# Patient Record
Sex: Male | Born: 1970 | Hispanic: No | Marital: Married | Smoking: Never smoker
Health system: Southern US, Community
[De-identification: ages and names within clinical notes are randomized; demographics above are authoritative.]

## PROBLEM LIST (undated history)

## (undated) DIAGNOSIS — M1990 Unspecified osteoarthritis, unspecified site: Secondary | ICD-10-CM

## (undated) DIAGNOSIS — D689 Coagulation defect, unspecified: Secondary | ICD-10-CM

## (undated) DIAGNOSIS — G709 Myoneural disorder, unspecified: Secondary | ICD-10-CM

## (undated) DIAGNOSIS — F419 Anxiety disorder, unspecified: Secondary | ICD-10-CM

## (undated) DIAGNOSIS — R000 Tachycardia, unspecified: Secondary | ICD-10-CM

## (undated) HISTORY — DX: Unspecified osteoarthritis, unspecified site: M19.90

## (undated) HISTORY — DX: Coagulation defect, unspecified: D68.9

## (undated) HISTORY — PX: HIP ARTHROSCOPY W/ LABRAL DEBRIDEMENT: SHX1749

## (undated) HISTORY — DX: Tachycardia, unspecified: R00.0

## (undated) HISTORY — DX: Anxiety disorder, unspecified: F41.9

## (undated) HISTORY — DX: Myoneural disorder, unspecified: G70.9

---

## 2016-07-03 ENCOUNTER — Encounter (INDEPENDENT_AMBULATORY_CARE_PROVIDER_SITE_OTHER): Payer: Self-pay | Admitting: Orthopaedic Surgery

## 2016-07-03 ENCOUNTER — Ambulatory Visit (INDEPENDENT_AMBULATORY_CARE_PROVIDER_SITE_OTHER): Payer: 59

## 2016-07-03 ENCOUNTER — Ambulatory Visit (INDEPENDENT_AMBULATORY_CARE_PROVIDER_SITE_OTHER): Payer: 59 | Admitting: Orthopaedic Surgery

## 2016-07-03 DIAGNOSIS — M25551 Pain in right hip: Secondary | ICD-10-CM

## 2016-07-03 DIAGNOSIS — M1611 Unilateral primary osteoarthritis, right hip: Secondary | ICD-10-CM

## 2016-07-03 NOTE — Progress Notes (Deleted)
   Office Visit Note   Patient: Ricky Andersen           Date of Birth: 01-01-1971           MRN: [id number] Visit Date: 07/03/2016              Requested by: No referring provider defined for this encounter. PCP: No PCP Per Patient   Assessment & Plan: Visit Diagnoses:  1. Primary osteoarthritis of right hip   2. Pain of right hip joint     Plan: ***  Follow-Up Instructions: Return if symptoms worsen or fail to improve.   Orders:  Orders Placed This Encounter  Procedures  . XR HIP UNILAT W OR W/O PELVIS 2-3 VIEWS RIGHT   No orders of the defined types were placed in this encounter.     Procedures: No procedures performed   Clinical Data: No additional findings.   Subjective: Chief Complaint  Patient presents with  . Right Hip - Pain    PAIN FOR 2 YEARS.    HPI  Review of Systems   Objective: Vital Signs: There were no vitals taken for this visit.  Physical Exam  Ortho Exam  Specialty Comments:  No specialty comments available.  Imaging: No results found.   PMFS History: There are no active problems to display for this patient.  No past medical history on file.  No family history on file.  No past surgical history on file. Social History   Occupational History  . Not on file.   Social History Main Topics  . Smoking status: Not on file  . Smokeless tobacco: Not on file  . Alcohol use Not on file  . Drug use: Unknown  . Sexual activity: Not on file

## 2016-07-03 NOTE — Progress Notes (Signed)
Office Visit Note   Patient: Ricky Andersen           Date of Birth: 1970/10/16           MRN: [id number] Visit Date: 07/03/2016              Requested by: No referring provider defined for this encounter. PCP: No PCP Per Patient   Assessment & Plan: Visit Diagnoses:  1. Primary osteoarthritis of right hip   2. Pain of right hip joint     Plan:  - Total face to face encounter time was greater than 45 minutes and over half of this time was spent in counseling and/or coordination of care. - had extensive discussion about his options; discussed conventional THA vs hip resurfacing and the r/b/a - I will refer him to the orthopedist in [facility] that is doing hip resurfacing and go from there - brochure on hip replacement was given today  Follow-Up Instructions: Return if symptoms worsen or fail to improve.   Orders:  Orders Placed This Encounter  Procedures  . XR HIP UNILAT W OR W/O PELVIS 2-3 VIEWS RIGHT   No orders of the defined types were placed in this encounter.     Procedures: No procedures performed   Clinical Data: No additional findings.   Subjective: Chief Complaint  Patient presents with  . Right Hip - Pain    PAIN FOR 2 YEARS.    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  Does have limp.  Severe decline in quality of life.  Pain is in the groin.  Has had right hip scope with labral debridement with 3 month relief but is now back to the same.  Denies knee pain, back pain, cannot take nsaids.  Has not had steroid injections.      Review of Systems  Constitutional: Negative.   Respiratory: Negative.   Cardiovascular: Negative.   Gastrointestinal: Negative.   All other systems reviewed and are negative.    Objective: Vital Signs: There were no vitals taken for this visit.  Physical Exam  Constitutional: He is oriented to person, place, and time. He appears well-developed and well-nourished.  HENT:  Head:  Normocephalic.  Eyes: Pupils are equal, round, and reactive to light.  Neck: Normal range of motion.  Cardiovascular: Intact distal pulses.   Pulmonary/Chest: Effort normal.  Abdominal: Soft.  Neurological: He is alert and oriented to person, place, and time.  Skin: Skin is warm. Capillary refill takes less than 2 seconds.  Psychiatric: He has a normal mood and affect. His behavior is normal. Judgment and thought content normal.  Nursing note and vitals reviewed.   Right Hip Exam   Tenderness  The patient is experiencing no tenderness.     Muscle Strength  The patient has normal right hip strength.  Comments:  + stinchfield, pain with IR - FABER, radic, sciatica      Specialty Comments:  No specialty comments available.  Imaging: Xr Hip Unilat W Or W/o Pelvis 2-3 Views Right  Result Date: 07/03/2016 Advanced right hip DJD    PMFS History: There are no active problems to display for this patient.  Past Medical History:  Diagnosis Date  . Arthritis     Family History  Problem Relation Age of Onset  . Anesthesia problems Neg Hx   . Broken bones Neg Hx   . Cancer Neg Hx   . Clotting disorder Neg Hx   .  Collagen disease Neg Hx   . Diabetes Neg Hx   . Dislocations Neg Hx   . Osteoporosis Neg Hx   . Rheumatologic disease Neg Hx   . Scoliosis Neg Hx   . Severe sprains Neg Hx     History reviewed. No pertinent surgical history. Social History   Occupational History  . Not on file.   Social History Main Topics  . Smoking status: Never Smoker  . Smokeless tobacco: Never Used  . Alcohol use Not on file  . Drug use: No  . Sexual activity: Yes

## 2016-07-08 NOTE — Addendum Note (Signed)
Addended by: Azucena Cecil on: 07/08/2016 08:37 AM   Modules accepted: Miquel Dunn

## 2016-08-02 ENCOUNTER — Telehealth (INDEPENDENT_AMBULATORY_CARE_PROVIDER_SITE_OTHER): Payer: Self-pay | Admitting: *Deleted

## 2016-08-02 NOTE — Telephone Encounter (Signed)
Pt. Called requesting copy of high resolution X-Ray from 07/03/2016.CB: [phone]

## 2016-08-02 NOTE — Telephone Encounter (Signed)
Left message on machine for patient that his CD is ready for pick up.

## 2016-08-02 NOTE — Telephone Encounter (Signed)
Can you get this for him please? Cd Copy of Xray?

## 2016-08-20 ENCOUNTER — Other Ambulatory Visit (INDEPENDENT_AMBULATORY_CARE_PROVIDER_SITE_OTHER): Payer: Self-pay | Admitting: Orthopaedic Surgery

## 2016-08-20 MED ORDER — CYCLOBENZAPRINE HCL 5 MG PO TABS: 5.0000 mg | ORAL_TABLET | Freq: Three times a day (TID) | ORAL | 2 refills | Status: DC | PRN

## 2016-08-20 MED ORDER — METHYLPREDNISOLONE 4 MG PO TBPK: ORAL_TABLET | ORAL | 0 refills | Status: DC

## 2016-08-28 ENCOUNTER — Telehealth (INDEPENDENT_AMBULATORY_CARE_PROVIDER_SITE_OTHER): Payer: Self-pay | Admitting: Orthopaedic Surgery

## 2016-08-28 DIAGNOSIS — M1611 Unilateral primary osteoarthritis, right hip: Secondary | ICD-10-CM

## 2016-08-28 NOTE — Telephone Encounter (Signed)
Patient is going back home for surgery R hip, so that he can recover with family there.  But since he does not have a GP he is in need of EKG. Are you able to help and order that?  Please call if needed he can explain more.

## 2016-08-31 NOTE — Telephone Encounter (Signed)
Please advise 

## 2016-08-31 NOTE — Telephone Encounter (Signed)
Sure - that's fine

## 2016-09-01 NOTE — Telephone Encounter (Signed)
Order made

## 2016-09-14 ENCOUNTER — Ambulatory Visit (HOSPITAL_COMMUNITY)
Admission: RE | Admit: 2016-09-14 | Discharge: 2016-09-14 | Disposition: A | Discharge status: Home / Self Care | Payer: 59 | Source: Ambulatory Visit | Attending: Internal Medicine | Admitting: Internal Medicine

## 2016-09-14 DIAGNOSIS — M1611 Unilateral primary osteoarthritis, right hip: Secondary | ICD-10-CM | POA: Insufficient documentation

## 2017-01-03 ENCOUNTER — Other Ambulatory Visit: Payer: Self-pay | Admitting: Internal Medicine

## 2017-01-03 DIAGNOSIS — G43109 Migraine with aura, not intractable, without status migrainosus: Secondary | ICD-10-CM

## 2017-01-03 DIAGNOSIS — H9319 Tinnitus, unspecified ear: Secondary | ICD-10-CM

## 2017-01-03 DIAGNOSIS — H539 Unspecified visual disturbance: Secondary | ICD-10-CM

## 2017-01-03 DIAGNOSIS — L568 Other specified acute skin changes due to ultraviolet radiation: Secondary | ICD-10-CM

## 2017-01-17 ENCOUNTER — Ambulatory Visit
Admission: RE | Admit: 2017-01-17 | Discharge: 2017-01-17 | Disposition: A | Discharge status: Home / Self Care | Payer: 59 | Source: Ambulatory Visit | Attending: Internal Medicine | Admitting: Internal Medicine

## 2017-01-17 DIAGNOSIS — H539 Unspecified visual disturbance: Secondary | ICD-10-CM

## 2017-01-17 DIAGNOSIS — H9319 Tinnitus, unspecified ear: Secondary | ICD-10-CM

## 2017-01-17 DIAGNOSIS — G43109 Migraine with aura, not intractable, without status migrainosus: Secondary | ICD-10-CM

## 2017-01-17 DIAGNOSIS — L568 Other specified acute skin changes due to ultraviolet radiation: Secondary | ICD-10-CM

## 2017-01-17 MED ORDER — GADOBENATE DIMEGLUMINE 529 MG/ML IV SOLN
20.0000 mL | Freq: Once | INTRAVENOUS | Status: AC | PRN
  Administered 2017-01-17: 20 mL via INTRAVENOUS

## 2017-07-24 ENCOUNTER — Ambulatory Visit (INDEPENDENT_AMBULATORY_CARE_PROVIDER_SITE_OTHER): Payer: 59

## 2017-07-24 ENCOUNTER — Ambulatory Visit (INDEPENDENT_AMBULATORY_CARE_PROVIDER_SITE_OTHER): Payer: 59 | Admitting: Orthopaedic Surgery

## 2017-07-24 DIAGNOSIS — G8929 Other chronic pain: Secondary | ICD-10-CM

## 2017-07-24 DIAGNOSIS — M1611 Unilateral primary osteoarthritis, right hip: Secondary | ICD-10-CM

## 2017-07-24 DIAGNOSIS — M25561 Pain in right knee: Secondary | ICD-10-CM

## 2017-07-24 NOTE — Progress Notes (Signed)
Office Visit Note   Patient: Ricky Andersen           Date of Birth: 1970/11/30           MRN: [id number] Visit Date: 07/24/2017              Requested by: No referring provider defined for this encounter. PCP: Patient, No Pcp Per   Assessment & Plan: Visit Diagnoses:  1. Primary osteoarthritis of right hip   2. Chronic pain of right knee     Plan: Impression is stable right total hip replacement and right patellar tendinitis.  Referral to physical therapy for hip flexor weakness and patella tendinitis.  Patellar tendon strap was provided today.  Questions encouraged and answered.  Follow-up as needed.  Follow-Up Instructions: Return if symptoms worsen or fail to improve.   Orders:  Orders Placed This Encounter  Procedures  . XR KNEE 3 VIEW RIGHT  . XR HIP UNILAT W OR W/O PELVIS 2-3 VIEWS RIGHT   No orders of the defined types were placed in this encounter.     Procedures: No procedures performed   Clinical Data: No additional findings.   Subjective: No chief complaint on file.   Patient is a 46 year old gentleman comes in for follow-up of his right hip and knee pain.  He underwent right total hip replacement in [place] earlier this year and he is just having some hip flexion weakness otherwise he is done well.  His right knee hurts directly over the tibial tubercle.  He does have a history of Osgood-Schlatter's disease as a child.  He rides his bicycle as exercise.  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    Review of Systems  Constitutional: Negative.   All other systems reviewed and are negative.    Objective: Vital Signs: There were no vitals taken for this visit.  Physical Exam  Constitutional: He is oriented to person, place, and time. He appears well-developed and well-nourished.  HENT:  Head: Normocephalic and atraumatic.  Eyes: Pupils are equal, round, and reactive to light.  Neck: Neck supple.  Pulmonary/Chest: Effort normal.  Abdominal: Soft.   Musculoskeletal: Normal range of motion.  Neurological: He is alert and oriented to person, place, and time.  Skin: Skin is warm.  Psychiatric: He has a normal mood and affect. His behavior is normal. Judgment and thought content normal.  Nursing note and vitals reviewed.   Ortho Exam Right hip exam is benign.  Fully healed surgical scar.  Right knee exam shows no joint effusion.  He is very tender over the tibial tubercle.  Extensor mechanism is intact. Specialty Comments:  No specialty comments available.  Imaging: Xr Hip Unilat W Or W/o Pelvis 2-3 Views Right  Result Date: 07/24/2017 Stable right total hip replacement in good alignment without any complications  Xr Knee 3 View Right  Result Date: 07/24/2017 No significant degenerative joint disease no acute findings.  Ossification of distal patella tendon.    PMFS History: There are no active problems to display for this patient.  No past medical history on file.  Family History  Problem Relation Age of Onset  . Anesthesia problems Neg Hx   . Broken bones Neg Hx   . Cancer Neg Hx   . Clotting disorder Neg Hx   . Collagen disease Neg Hx   . Diabetes Neg Hx   . Dislocations Neg Hx   . Osteoporosis Neg Hx   . Rheumatologic disease Neg Hx   . Scoliosis  Neg Hx   . Severe sprains Neg Hx     Past Surgical History:  Procedure Laterality Date  . HIP ARTHROSCOPY W/ LABRAL DEBRIDEMENT Right    Social History   Occupational History  . Not on file  Tobacco Use  . Smoking status: Never Smoker  . Smokeless tobacco: Never Used  Substance and Sexual Activity  . Alcohol use: Not on file  . Drug use: No  . Sexual activity: Yes

## 2018-10-29 ENCOUNTER — Encounter (INDEPENDENT_AMBULATORY_CARE_PROVIDER_SITE_OTHER): Payer: Self-pay | Admitting: Orthopaedic Surgery

## 2018-10-29 ENCOUNTER — Ambulatory Visit (INDEPENDENT_AMBULATORY_CARE_PROVIDER_SITE_OTHER): Payer: BLUE CROSS/BLUE SHIELD

## 2018-10-29 ENCOUNTER — Ambulatory Visit (INDEPENDENT_AMBULATORY_CARE_PROVIDER_SITE_OTHER): Payer: BLUE CROSS/BLUE SHIELD | Admitting: Orthopaedic Surgery

## 2018-10-29 DIAGNOSIS — M25551 Pain in right hip: Secondary | ICD-10-CM

## 2018-10-29 MED ORDER — PREDNISONE 10 MG (21) PO TBPK: ORAL_TABLET | ORAL | 0 refills | Status: DC

## 2018-10-29 MED ORDER — CYCLOBENZAPRINE HCL 5 MG PO TABS: 5.0000 mg | ORAL_TABLET | Freq: Three times a day (TID) | ORAL | 3 refills | Status: DC | PRN

## 2018-10-29 NOTE — Progress Notes (Signed)
Office Visit Note   Patient: Ricky Andersen           Date of Birth: 12-05-1970           MRN: [id number] Visit Date: 10/29/2018              Requested by: No referring provider defined for this encounter. PCP: Patient, No Pcp Per   Assessment & Plan: Visit Diagnoses:  1. Pain in right hip     Plan: Impression is right hip iliopsoas tendinitis.  Recommend relative rest, outpatient physical therapy, prednisone taper and Flexeril.  Patient has taken NSAIDs without significant relief.  We could consider cortisone injection if he does not see significant improvement.  Components appear to be well-positioned and without complication. Total face to face encounter time was greater than 25 minutes and over half of this time was spent in counseling and/or coordination of care.  Follow-Up Instructions: Return if symptoms worsen or fail to improve.   Orders:  Orders Placed This Encounter  Procedures  . XR HIP UNILAT W OR W/O PELVIS 2-3 VIEWS RIGHT   Meds ordered this encounter  Medications  . predniSONE (STERAPRED UNI-PAK 21 TAB) 10 MG (21) TBPK tablet    Sig: Take as directed    Dispense:  21 tablet    Refill:  0  . cyclobenzaprine (FLEXERIL) 5 MG tablet    Sig: Take 1-2 tablets (5-10 mg total) by mouth 3 (three) times daily as needed for muscle spasms.    Dispense:  30 tablet    Refill:  3      Procedures: No procedures performed   Clinical Data: No additional findings.   Subjective: Chief Complaint  Patient presents with  . Right Hip - Pain    Imir is a 48 year old gentleman who comes in with couple weeks of increasing right hip pain that is worse with sitting.  The pain is localized to the groin and hip flexor region.  He denies any constant constitutional symptoms he had a recent fever but quickly resolved and was determined to be iron related to the hip.  He had a CRP level drawn recently and it was normal.   Review of Systems  Constitutional: Negative.   All  other systems reviewed and are negative.    Objective: Vital Signs: There were no vitals taken for this visit.  Physical Exam Vitals signs and nursing note reviewed.  Constitutional:      Appearance: He is well-developed.  HENT:     Head: Normocephalic and atraumatic.  Eyes:     Pupils: Pupils are equal, round, and reactive to light.  Neck:     Musculoskeletal: Neck supple.  Pulmonary:     Effort: Pulmonary effort is normal.  Abdominal:     Palpations: Abdomen is soft.  Musculoskeletal: Normal range of motion.  Skin:    General: Skin is warm.  Neurological:     Mental Status: He is alert and oriented to person, place, and time.  Psychiatric:        Behavior: Behavior normal.        Thought Content: Thought content normal.        Judgment: Judgment normal.     Ortho Exam Right hip exam shows a fully healed surgical scar.  There is no evidence of infection.  He has reproducible pain in the groin with a resisted straight leg raise.  Otherwise any exam is unremarkable. Specialty Comments:  No specialty comments available.  Imaging: Xr Hip  Unilat W Or W/o Pelvis 2-3 Views Right  Result Date: 10/29/2018 Stable right total hip replacement with good bony ingrowth.  Components are well-positioned.    PMFS History: There are no active problems to display for this patient.  History reviewed. No pertinent past medical history.  Family History  Problem Relation Age of Onset  . Anesthesia problems Neg Hx   . Broken bones Neg Hx   . Cancer Neg Hx   . Clotting disorder Neg Hx   . Collagen disease Neg Hx   . Diabetes Neg Hx   . Dislocations Neg Hx   . Osteoporosis Neg Hx   . Rheumatologic disease Neg Hx   . Scoliosis Neg Hx   . Severe sprains Neg Hx     Past Surgical History:  Procedure Laterality Date  . HIP ARTHROSCOPY W/ LABRAL DEBRIDEMENT Right    Social History   Occupational History  . Not on file  Tobacco Use  . Smoking status: Never Smoker  . Smokeless  tobacco: Never Used  Substance and Sexual Activity  . Alcohol use: Not on file  . Drug use: No  . Sexual activity: Yes

## 2018-11-05 ENCOUNTER — Encounter (INDEPENDENT_AMBULATORY_CARE_PROVIDER_SITE_OTHER): Payer: Self-pay | Admitting: Orthopaedic Surgery

## 2018-11-16 DIAGNOSIS — S76011D Strain of muscle, fascia and tendon of right hip, subsequent encounter: Secondary | ICD-10-CM | POA: Diagnosis not present

## 2018-11-16 DIAGNOSIS — M7631 Iliotibial band syndrome, right leg: Secondary | ICD-10-CM | POA: Diagnosis not present

## 2018-11-16 DIAGNOSIS — M545 Low back pain: Secondary | ICD-10-CM | POA: Diagnosis not present

## 2018-12-27 DIAGNOSIS — M7631 Iliotibial band syndrome, right leg: Secondary | ICD-10-CM | POA: Diagnosis not present

## 2018-12-27 DIAGNOSIS — M545 Low back pain: Secondary | ICD-10-CM | POA: Diagnosis not present

## 2018-12-27 DIAGNOSIS — S76011D Strain of muscle, fascia and tendon of right hip, subsequent encounter: Secondary | ICD-10-CM | POA: Diagnosis not present

## 2019-01-22 DIAGNOSIS — R42 Dizziness and giddiness: Secondary | ICD-10-CM | POA: Diagnosis not present

## 2019-01-22 DIAGNOSIS — R002 Palpitations: Secondary | ICD-10-CM | POA: Diagnosis not present

## 2019-01-22 DIAGNOSIS — F419 Anxiety disorder, unspecified: Secondary | ICD-10-CM | POA: Diagnosis not present

## 2019-01-22 DIAGNOSIS — R030 Elevated blood-pressure reading, without diagnosis of hypertension: Secondary | ICD-10-CM | POA: Diagnosis not present

## 2019-01-23 DIAGNOSIS — R209 Unspecified disturbances of skin sensation: Secondary | ICD-10-CM | POA: Diagnosis not present

## 2019-01-23 DIAGNOSIS — R42 Dizziness and giddiness: Secondary | ICD-10-CM | POA: Diagnosis not present

## 2019-01-23 DIAGNOSIS — R002 Palpitations: Secondary | ICD-10-CM | POA: Diagnosis not present

## 2019-01-24 ENCOUNTER — Telehealth: Payer: Self-pay | Admitting: *Deleted

## 2019-01-24 NOTE — Telephone Encounter (Signed)
REFERRAL SENT TO SCHEDULING AND NOTES ON FILE FROM KATIE HYATT,NP [phone]

## 2019-01-28 ENCOUNTER — Telehealth: Payer: Self-pay | Admitting: *Deleted

## 2019-01-28 ENCOUNTER — Other Ambulatory Visit: Payer: Self-pay | Admitting: *Deleted

## 2019-01-28 DIAGNOSIS — R002 Palpitations: Secondary | ICD-10-CM

## 2019-01-28 DIAGNOSIS — R42 Dizziness and giddiness: Secondary | ICD-10-CM

## 2019-01-28 NOTE — Telephone Encounter (Signed)
[employer] to ship 14 day cardiac event monitor to patient.  Instructions reviewed briefly as they are included in the monitor kit.

## 2019-01-31 ENCOUNTER — Encounter (INDEPENDENT_AMBULATORY_CARE_PROVIDER_SITE_OTHER): Payer: Self-pay

## 2019-01-31 DIAGNOSIS — R002 Palpitations: Secondary | ICD-10-CM | POA: Diagnosis not present

## 2019-01-31 DIAGNOSIS — R42 Dizziness and giddiness: Secondary | ICD-10-CM | POA: Diagnosis not present

## 2019-02-13 ENCOUNTER — Other Ambulatory Visit: Payer: Self-pay

## 2019-02-25 ENCOUNTER — Telehealth: Payer: Self-pay

## 2019-02-25 NOTE — Telephone Encounter (Signed)
I called pt. Pt's meds, allergies, and PMH were updated.  Pt believes that he may have had a TIA or panic attack on 01/17/19. He has had residual symptoms. He has had cardiac monitor which did not show anything concerning, per pt. Pt does report insomnia issues.  Pt has had a sleep study 9-10 years ago, was negative for osa, per pt.  Pt does endorse snoring.  Epworth Sleepiness Scale 0= would never doze 1= slight chance of dozing 2= moderate chance of dozing 3= high chance of dozing  Sitting and reading: 0 Watching TV: 2 Sitting inactive in a public place (ex. Theater or meeting): 0 As a passenger in a car for an hour without a break: 1 Lying down to rest in the afternoon: 1 Sitting and talking to someone: 0 Sitting quietly after lunch (no alcohol): 0 In a car, while stopped in traffic: 0 Total: 4  FSS: 37  Pt verbalized understanding of the doxy.me process.

## 2019-02-26 ENCOUNTER — Other Ambulatory Visit: Payer: Self-pay

## 2019-02-26 ENCOUNTER — Encounter: Payer: Self-pay | Admitting: Neurology

## 2019-02-26 ENCOUNTER — Ambulatory Visit (INDEPENDENT_AMBULATORY_CARE_PROVIDER_SITE_OTHER): Payer: BC Managed Care – PPO | Admitting: Neurology

## 2019-02-26 DIAGNOSIS — R419 Unspecified symptoms and signs involving cognitive functions and awareness: Secondary | ICD-10-CM | POA: Diagnosis not present

## 2019-02-26 DIAGNOSIS — R42 Dizziness and giddiness: Secondary | ICD-10-CM | POA: Diagnosis not present

## 2019-02-26 DIAGNOSIS — H81399 Other peripheral vertigo, unspecified ear: Secondary | ICD-10-CM

## 2019-02-26 DIAGNOSIS — G479 Sleep disorder, unspecified: Secondary | ICD-10-CM

## 2019-02-26 DIAGNOSIS — R202 Paresthesia of skin: Secondary | ICD-10-CM | POA: Diagnosis not present

## 2019-02-26 NOTE — Progress Notes (Signed)
Star Age, MD, PhD [facility] [address], [address] [place], [place] [address]   Virtual Visit via Video Note on 02/26/2019:  I connected with Ricky Andersen on 02/26/19 at  2:00 PM EDT by a video enabled telemedicine application and verified that I am speaking with the correct person using two identifiers.   I discussed the limitations of evaluation and management by telemedicine and the availability of in person appointments. The patient expressed understanding and agreed to proceed.  History of Present Illness: Ricky Andersen is a 48 year old right-handed gentleman with an underlying medical history of anxiety, and neck pain, who presents for a virtual, video based appointment via doxy.me for evaluation of his intermittent dizziness, foggy headedness, recent spelt of generalized weakness and tingling as well as abnormal head sensation.  Patient is unaccompanied today and joins via laptop.  I am located in my office.  He is referred by his primary care physician Dr. Virgina Jock and I reviewed a telemedicine note from 01/22/2019, at which time he saw a nurse practitioner [facility].  The patient reports an episode of sudden onset of tingling and generalized weakness on 01/17/2019.  He was in the process of doing a wellness walk and approximately half a mile into the walk his symptoms started.  He did not abort the walk and did not seek immediate medical attention, kept walking.  He felt that his heart was racing.  He has since then had intermittent dizziness, no actual room spinning type sensation but sometimes it feels like things are moving.  He has no history of migraines and has had some intermittent abnormal head sensations, no actual pain, some tingling down the left side of his head.  He had no one-sided weakness or numbness or droopy face or slurring of speech at the time of this event.  Some 2 years ago he had a brain MRI in the context of headache.  He had  some blurry vision at the time.  He had a brain MRI with and without contrast on 01/17/2017 and I reviewed the results: IMPRESSION: Normal MRI appearance of the brain.   No explanation for visual abnormality. Left side visible middle ear and facial nerve appear within normal limits.   He reports that he had a panic attack in the past around the time that he had his hip surgery.  His symptoms with the recent event felt different.  He had a recent cardiac event monitor which was unremarkable.  This was from 01/31/2019 through 02/13/2019, results are available in epic.  He had blood work through his primary care recently with unremarkable BMP, CBC, TSH.  He has not been sleeping well.  He has difficulty with sleep initiation and sleep maintenance for years.  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  Belsomra caused daytime grogginess.  He does take Lunesta as needed.  He has a family history of sleep apnea in his father.  He has eliminated caffeine in the past couple of weeks secondary to his insomnia.  His sleep has become worse.  He denies snoring.  He lives with his wife and 2 children.  His 95-year-old daughter sleeps in the bed with him.  They have no pets.He had blood work through his primary care physician's office on 01/23/2019 which I reviewed: BMP was unremarkable,, creatinine 1.0, BUN 21 at the time.  CBC with differential was unremarkable, TSH normal.    His Past Medical History Is Significant For: No past medical  history on file.  His Past Surgical History Is Significant For: Past Surgical History:  Procedure Laterality Date   HIP ARTHROSCOPY W/ LABRAL DEBRIDEMENT Right     His Family History Is Significant For: Family History  Problem Relation Age of Onset   Anesthesia problems Neg Hx    Broken bones Neg Hx    Cancer Neg Hx    Clotting disorder Neg Hx    Collagen disease Neg Hx    Diabetes Neg Hx    Dislocations Neg Hx    Osteoporosis Neg Hx    Rheumatologic disease Neg  Hx    Scoliosis Neg Hx    Severe sprains Neg Hx     His Social History Is Significant For: Social History   Socioeconomic History   Marital status: Married    Spouse name: Not on file   Number of children: Not on file   Years of education: Not on file   Highest education level: Not on file  Occupational History   Not on file  Social Needs   Financial resource strain: Not on file   Food insecurity    Worry: Not on file    Inability: Not on file   Transportation needs    Medical: Not on file    Non-medical: Not on file  Tobacco Use   Smoking status: Never Smoker   Smokeless tobacco: Never Used  Substance and Sexual Activity   Alcohol use: Not on file   Drug use: No   Sexual activity: Yes  Lifestyle   Physical activity    Days per week: Not on file    Minutes per session: Not on file   Stress: Not on file  Relationships   Social connections    Talks on phone: Not on file    Gets together: Not on file    Attends religious service: Not on file    Active member of club or organization: Not on file    Attends meetings of clubs or organizations: Not on file    Relationship status: Not on file  Other Topics Concern   Not on file  Social History Narrative   Not on file    His Allergies Are:  No Known Allergies:   His Current Medications Are:  Outpatient Encounter Medications as of 02/26/2019  Medication Sig   Eszopiclone (LUNESTA PO) Take by mouth.   propranolol (INDERAL) 10 MG tablet Take 10 mg by mouth daily as needed.   No facility-administered encounter medications on file as of 02/26/2019.   :   Review of Systems:  Out of a complete 14 point review of systems, all are reviewed and negative with the exception of these symptoms as listed below:  Observations/Objective: On examination he is pleasant and conversant, in no acute distress, perhaps mildly anxious appearing.  Good comprehension skills, good language skills, he is oriented,  speech is clear without dysarthria or pressure, no hypophonia.  Hearing is grossly intact, face is symmetric with normal facial animation noted, extraocular movements well preserved in all directions without obvious nystagmus noted.  Airway examination reveals a somewhat small airway entry, slightly larger uvula, otherwise normal findings, tongue protrudes centrally in palate elevates symmetrically, shoulder height is equal, shoulder shrug fairly symmetrical perhaps right shoulder slightly higher than left. Motor examination reveals normal-appearing muscle bulk.  No abnormal involuntary movements are noted.  No drift, no postural or action tremor in the upper extremities.  Romberg is negative.  Cerebellar testing shows no dysmetria or intention  tremor, fine motor skills well preserved in the upper extremities with finger taps and hand movements.  He stands without difficulty, walking is normal, posture normal, well preserved arm swing,Tandem walk unremarkable.  Assessment and Plan:  In summary, Ricky Andersen is a very pleasant 48 y.o.-year old male with an underlying medical history of anxiety, and neck pain, who presents for evaluation of his dizziness and recent spell about 5 weeks ago of sudden onset of tingling and global weakness. He has since then had more anxiety and insomnia, intermittent tingling in the head and pressure like sensation, mental fogginess. No Telltale migrainous headaches or vertigo spells.  Today's examination shows overall benign findings, albeit a limited examination was only possible through the video visit. I suggested with proceed with a brain MRI w/wo contrast. His history is not suggestive of a TIA.  Given the intermittent nature of his symptoms we will also proceed with an EEG.  We will call him with his test results to keep them posted.  Given his sleep difficulty, we can certainly consider a sleep study.  He is advised to think about it. dI did not suggest any new medications  today.  We will keep them posted as to his test results by phone call and consider a follow-up appointment after testing if needed. I answered all his questions today and he was in agreement.  Follow Up Instructions:    I discussed the assessment and treatment plan with the patient. The patient was provided an opportunity to ask questions and all were answered. The patient agreed with the plan and demonstrated an understanding of the instructions.   The patient was advised to call back or seek an in-person evaluation if the symptoms worsen or if the condition fails to improve as anticipated.   I provided 40 minutes of non-face-to-face time during this encounter.   Star Age, MD

## 2019-02-26 NOTE — Patient Instructions (Signed)
Given verbally, during today's virtual video-based encounter, with verbal feedback received.   

## 2019-03-03 DIAGNOSIS — F411 Generalized anxiety disorder: Secondary | ICD-10-CM | POA: Diagnosis not present

## 2019-03-04 ENCOUNTER — Telehealth: Payer: Self-pay | Admitting: Neurology

## 2019-03-04 NOTE — Telephone Encounter (Signed)
I spoke to the patient and due to the cost would like to hold off. I did offer the payment plan with him  Dillon Bjork: [phone] (exp. 03/04/19 to 08/30/19)

## 2019-03-06 ENCOUNTER — Encounter: Payer: Self-pay | Admitting: Neurology

## 2019-03-06 NOTE — Addendum Note (Signed)
Addended by: Lester Monroe A on: 03/06/2019 09:13 AM   Modules accepted: Orders

## 2019-03-07 DIAGNOSIS — Z20828 Contact with and (suspected) exposure to other viral communicable diseases: Secondary | ICD-10-CM | POA: Diagnosis not present

## 2019-03-12 DIAGNOSIS — F411 Generalized anxiety disorder: Secondary | ICD-10-CM | POA: Diagnosis not present

## 2019-03-17 DIAGNOSIS — F411 Generalized anxiety disorder: Secondary | ICD-10-CM | POA: Diagnosis not present

## 2019-03-24 DIAGNOSIS — F411 Generalized anxiety disorder: Secondary | ICD-10-CM | POA: Diagnosis not present

## 2019-03-27 DIAGNOSIS — R42 Dizziness and giddiness: Secondary | ICD-10-CM | POA: Diagnosis not present

## 2019-03-27 DIAGNOSIS — R202 Paresthesia of skin: Secondary | ICD-10-CM | POA: Diagnosis not present

## 2019-03-27 DIAGNOSIS — G4700 Insomnia, unspecified: Secondary | ICD-10-CM | POA: Diagnosis not present

## 2019-03-27 DIAGNOSIS — G4452 New daily persistent headache (NDPH): Secondary | ICD-10-CM | POA: Diagnosis not present

## 2019-03-31 DIAGNOSIS — F411 Generalized anxiety disorder: Secondary | ICD-10-CM | POA: Diagnosis not present

## 2019-04-07 DIAGNOSIS — F411 Generalized anxiety disorder: Secondary | ICD-10-CM | POA: Diagnosis not present

## 2019-04-09 ENCOUNTER — Ambulatory Visit: Payer: BC Managed Care – PPO | Admitting: Neurology

## 2019-04-09 DIAGNOSIS — H81399 Other peripheral vertigo, unspecified ear: Secondary | ICD-10-CM | POA: Diagnosis not present

## 2019-04-09 DIAGNOSIS — G479 Sleep disorder, unspecified: Secondary | ICD-10-CM

## 2019-04-09 DIAGNOSIS — R42 Dizziness and giddiness: Secondary | ICD-10-CM

## 2019-04-09 DIAGNOSIS — R202 Paresthesia of skin: Secondary | ICD-10-CM

## 2019-04-09 DIAGNOSIS — R419 Unspecified symptoms and signs involving cognitive functions and awareness: Secondary | ICD-10-CM

## 2019-04-09 DIAGNOSIS — G4733 Obstructive sleep apnea (adult) (pediatric): Secondary | ICD-10-CM | POA: Diagnosis not present

## 2019-04-09 DIAGNOSIS — R0683 Snoring: Secondary | ICD-10-CM

## 2019-04-15 ENCOUNTER — Encounter: Payer: Self-pay | Admitting: Neurology

## 2019-04-17 ENCOUNTER — Telehealth: Payer: Self-pay

## 2019-04-17 NOTE — Procedures (Signed)
  Patient Information     First Name: Ricky Last Name: Andersen ID: [id number]  Birth Date: 09/28/1970 Age: 48 Gender: Male  Referring Provider: Jenny Reichmann Russo,MD        Epworth:  4   Sleep Study Information     Study Date: Apr 09, 2019 S/H/A Version: 001.001.001.001 / 4.1.1528 / 27  History:      48 year old man with a history of anxiety, and neck pain, who reports intermittent dizziness, and foggy headedness.    Summary & Diagnosis:      Primary snoring Recommendations:        This home sleep test does not demonstrate any significant obstructive or central sleep disordered breathing. Some snoring was noted and appeared to be mild and intermittent. Other causes of the patient's symptoms, including circadian rhythm disturbances, an underlying mood disorder, medication effect and/or an underlying medical problem cannot be ruled out based on this test. Clinical correlation is recommended. The patient should be cautioned not to drive, work at heights, or operate dangerous or heavy equipment when tired or sleepy. Review and reiteration of good sleep hygiene measures should be pursued with any patient. The patient can follow up with his referring provider, who will be notified of the test results.   I certify that I have reviewed the raw data recording prior to the issuance of this report in accordance with the standards of the American Academy of Sleep Medicine (AASM).  Star Age, MD, PhD Diplomat, ABPN (Neurology and Sleep)                  Sleep Summary  Oxygen Saturation Statistics   Start Study Time: End Study Time: Total Recording Time:  11:27:00PM 6:33:46 AM 7 hrs, 6 min  Total Sleep Time % REM of Sleep Time:  6 hrs, 18 min 30.1    Mean: 94 Minimum: 90 Maximum: 98  Mean of Desaturations Nadirs (%):   92  Oxygen Desatur. %: 4-9 10-20 >20 Total  Events Number Total  3 100.0  0 0.0  0 0.0  3 100.0  Oxygen Saturation: <90 <=88 <85 <80 <70  Duration (minutes): Sleep %  0.0 0.0 0.0 0.0 0.0 0.0 0.0 0.0 0.0 0.0     Respiratory Indices      Total Events REM NREM All Night  pRDI:  31  pAHI:  21 ODI:  3  pAHIc:  2  % CSR: 0.0 7.5 2.7 0.0 0.6 3.9 3.6 0.7 0.3 5.0 3.4 0.5 0.4       Pulse Rate Statistics during Sleep (BPM)      Mean: 72 Minimum: 54 Maximum: 104    Indices are calculated using technically valid sleep time of  6 hrs, 16 min. Central-Indices are calculated using technically valid sleep time of  5  hrs, 42 min. pRDI/pAHI are calculated using oxi desaturations ? 3% Sit BodyPosition Statistics  Position Supine Prone Right Left Non-Supine  Sleep (min) 117.0 182.5 63.0 16.0 261.5  Sleep % 30.9 48.2 16.6 4.2 69.1  pRDI 3.6 7.7 1.0 0.0 5.6  pAHI 3.1 4.7 1.0 0.0 3.5  ODI 1.0 0.3 0.0 0.0 0.2     Snoring Statistics Snoring Level (dB) >40 >50 >60 >70 >80 >Threshold (45)  Sleep (min) 23.8 2.3 0.5 0.0 0.0 3.5  Sleep % 6.3 0.6 0.1 0.0 0.0 0.9    Mean: 40 dB Sleep Stages Chart   * Reference values are according to AASM guidelines

## 2019-04-17 NOTE — Progress Notes (Signed)
Patient referred by Dr. Virgina Jock, seen by me on 02/26/19 in [facility] for dizziness, HST on 04/09/19.   Please call and notify the patient that the recent home sleep test did not show any significant obstructive sleep apnea. Mild intermittent snoring was noted.  At this juncture, he can FU with PCP. He also has FU pending with Dr. Everette Rank at [facility].  Please remind patient to try to maintain good sleep hygiene, which means: Keep a regular sleep and wake schedule and make enough time for sleep (7 1/2 to 8 1/2 hours for the average adult), try not to exercise or have a meal within 2 hours of your bedtime, try to keep your bedroom conducive for sleep, that is, cool and dark, without light distractors such as an illuminated alarm clock, and refrain from watching TV right before sleep or in the middle of the night and do not keep the TV or radio on during the night. If a nightlight is used, have it away from the visual field. Also, try not to use or play on electronic devices at bedtime, such as your cell phone, tablet PC or laptop. If you like to read at bedtime on an electronic device, try to dim the background light as much as possible. Do not eat in the middle of the night. Keep pets away from the bedroom environment. For stress relief, try meditation, deep breathing exercises (there are many books and CDs available), a white noise machine or fan can help to diffuse other noise distractors, such as traffic noise. Do not drink alcohol before bedtime, as it can disturb sleep and cause middle of the night awakenings. Never mix alcohol and sedating medications! Avoid narcotic pain medication close to bedtime, as opioids/narcotics can suppress breathing drive and breathing effort.   Thanks,  Star Age, MD, PhD [facility])

## 2019-04-17 NOTE — Telephone Encounter (Signed)
I called pt. I advised pt that Dr. Rexene Alberts reviewed pt's sleep study and found that pt did not show any significant osa but mild intermittent snoring was noted. Dr. Rexene Alberts recommends that pt follow up with Dr. Virgina Jock and Dr. Everette Rank. I reviewed sleep hygiene recommendations with the pt, including trying to keep a regular sleep wake schedule, avoiding electronics in the bedroom, keeping the bedroom cool, dark, and quiet, and avoiding eating or exercising within 2 hours of bedtime as well as eating in the middle of the night. I advised pt to keep pets out of the bedroom. I discussed with pt the importance of stress relief and to try meditation, deep breathing exercises, and/or a white noise machine or fan to diffuse other noise distractors. I advised pt to not drink alcohol before bedtime and to never mix alcohol and sedating medications. Pt was advised to avoid narcotic pain medication close to bedtime. I advised pt that a copy of these sleep study results will be sent to Dr. Virgina Jock. Pt verbalized understanding of results. Pt had no questions at this time but was encouraged to call back if questions arise.

## 2019-04-17 NOTE — Telephone Encounter (Signed)
-----   Message from Ricky Age, MD sent at 04/17/2019  8:35 AM EDT ----- Patient referred by Dr. Virgina Jock, seen by me on 02/26/19 in [facility] for dizziness, HST on 04/09/19.   Please call and notify the patient that the recent home sleep test did not show any significant obstructive sleep apnea. Mild intermittent snoring was noted.  At this juncture, he can FU with PCP. He also has FU pending with Dr. Everette Rank at [facility].  Please remind patient to try to maintain good sleep hygiene, which means: Keep a regular sleep and wake schedule and make enough time for sleep (7 1/2 to 8 1/2 hours for the average adult), try not to exercise or have a meal within 2 hours of your bedtime, try to keep your bedroom conducive for sleep, that is, cool and dark, without light distractors such as an illuminated alarm clock, and refrain from watching TV right before sleep or in the middle of the night and do not keep the TV or radio on during the night. If a nightlight is used, have it away from the visual field. Also, try not to use or play on electronic devices at bedtime, such as your cell phone, tablet PC or laptop. If you like to read at bedtime on an electronic device, try to dim the background light as much as possible. Do not eat in the middle of the night. Keep pets away from the bedroom environment. For stress relief, try meditation, deep breathing exercises (there are many books and CDs available), a white noise machine or fan can help to diffuse other noise distractors, such as traffic noise. Do not drink alcohol before bedtime, as it can disturb sleep and cause middle of the night awakenings. Never mix alcohol and sedating medications! Avoid narcotic pain medication close to bedtime, as opioids/narcotics can suppress breathing drive and breathing effort.   Thanks,  Ricky Age, MD, PhD [facility])

## 2019-04-23 DIAGNOSIS — F4322 Adjustment disorder with anxiety: Secondary | ICD-10-CM | POA: Diagnosis not present

## 2019-04-28 DIAGNOSIS — R202 Paresthesia of skin: Secondary | ICD-10-CM | POA: Diagnosis not present

## 2019-04-28 DIAGNOSIS — F411 Generalized anxiety disorder: Secondary | ICD-10-CM | POA: Diagnosis not present

## 2019-05-05 DIAGNOSIS — F411 Generalized anxiety disorder: Secondary | ICD-10-CM | POA: Diagnosis not present

## 2019-05-12 DIAGNOSIS — F411 Generalized anxiety disorder: Secondary | ICD-10-CM | POA: Diagnosis not present

## 2019-05-29 DIAGNOSIS — F4320 Adjustment disorder, unspecified: Secondary | ICD-10-CM | POA: Diagnosis not present

## 2019-06-06 DIAGNOSIS — F422 Mixed obsessional thoughts and acts: Secondary | ICD-10-CM | POA: Diagnosis not present

## 2019-06-06 DIAGNOSIS — F411 Generalized anxiety disorder: Secondary | ICD-10-CM | POA: Diagnosis not present

## 2019-06-07 DIAGNOSIS — F4320 Adjustment disorder, unspecified: Secondary | ICD-10-CM | POA: Diagnosis not present

## 2019-06-12 DIAGNOSIS — R42 Dizziness and giddiness: Secondary | ICD-10-CM | POA: Diagnosis not present

## 2019-06-12 DIAGNOSIS — F422 Mixed obsessional thoughts and acts: Secondary | ICD-10-CM | POA: Diagnosis not present

## 2019-06-12 DIAGNOSIS — R202 Paresthesia of skin: Secondary | ICD-10-CM | POA: Diagnosis not present

## 2019-06-12 DIAGNOSIS — F4320 Adjustment disorder, unspecified: Secondary | ICD-10-CM | POA: Diagnosis not present

## 2019-06-19 DIAGNOSIS — F4320 Adjustment disorder, unspecified: Secondary | ICD-10-CM | POA: Diagnosis not present

## 2019-07-03 DIAGNOSIS — F4320 Adjustment disorder, unspecified: Secondary | ICD-10-CM | POA: Diagnosis not present

## 2019-07-04 DIAGNOSIS — F4322 Adjustment disorder with anxiety: Secondary | ICD-10-CM | POA: Diagnosis not present

## 2019-07-10 DIAGNOSIS — R202 Paresthesia of skin: Secondary | ICD-10-CM | POA: Diagnosis not present

## 2019-07-10 DIAGNOSIS — H538 Other visual disturbances: Secondary | ICD-10-CM | POA: Diagnosis not present

## 2019-07-10 DIAGNOSIS — F4320 Adjustment disorder, unspecified: Secondary | ICD-10-CM | POA: Diagnosis not present

## 2019-07-10 DIAGNOSIS — R413 Other amnesia: Secondary | ICD-10-CM | POA: Diagnosis not present

## 2019-07-10 DIAGNOSIS — G4700 Insomnia, unspecified: Secondary | ICD-10-CM | POA: Diagnosis not present

## 2019-08-01 DIAGNOSIS — G4709 Other insomnia: Secondary | ICD-10-CM | POA: Diagnosis not present

## 2019-08-01 DIAGNOSIS — F4322 Adjustment disorder with anxiety: Secondary | ICD-10-CM | POA: Diagnosis not present

## 2019-08-14 DIAGNOSIS — F4323 Adjustment disorder with mixed anxiety and depressed mood: Secondary | ICD-10-CM | POA: Diagnosis not present

## 2019-08-14 DIAGNOSIS — F5101 Primary insomnia: Secondary | ICD-10-CM | POA: Diagnosis not present

## 2019-08-19 DIAGNOSIS — F4323 Adjustment disorder with mixed anxiety and depressed mood: Secondary | ICD-10-CM | POA: Diagnosis not present

## 2019-08-19 DIAGNOSIS — F5101 Primary insomnia: Secondary | ICD-10-CM | POA: Diagnosis not present

## 2019-08-21 DIAGNOSIS — R002 Palpitations: Secondary | ICD-10-CM | POA: Diagnosis not present

## 2019-08-21 DIAGNOSIS — R030 Elevated blood-pressure reading, without diagnosis of hypertension: Secondary | ICD-10-CM | POA: Diagnosis not present

## 2019-08-21 DIAGNOSIS — Z0000 Encounter for general adult medical examination without abnormal findings: Secondary | ICD-10-CM | POA: Diagnosis not present

## 2019-08-21 DIAGNOSIS — Z1331 Encounter for screening for depression: Secondary | ICD-10-CM | POA: Diagnosis not present

## 2019-08-21 DIAGNOSIS — R7301 Impaired fasting glucose: Secondary | ICD-10-CM | POA: Diagnosis not present

## 2019-08-21 DIAGNOSIS — F439 Reaction to severe stress, unspecified: Secondary | ICD-10-CM | POA: Diagnosis not present

## 2019-08-28 DIAGNOSIS — F4323 Adjustment disorder with mixed anxiety and depressed mood: Secondary | ICD-10-CM | POA: Diagnosis not present

## 2019-08-28 DIAGNOSIS — F5101 Primary insomnia: Secondary | ICD-10-CM | POA: Diagnosis not present

## 2019-09-02 DIAGNOSIS — F5101 Primary insomnia: Secondary | ICD-10-CM | POA: Diagnosis not present

## 2019-09-02 DIAGNOSIS — F4323 Adjustment disorder with mixed anxiety and depressed mood: Secondary | ICD-10-CM | POA: Diagnosis not present

## 2019-09-13 DIAGNOSIS — Z20828 Contact with and (suspected) exposure to other viral communicable diseases: Secondary | ICD-10-CM | POA: Diagnosis not present

## 2019-09-16 DIAGNOSIS — F4323 Adjustment disorder with mixed anxiety and depressed mood: Secondary | ICD-10-CM | POA: Diagnosis not present

## 2019-09-16 DIAGNOSIS — F5101 Primary insomnia: Secondary | ICD-10-CM | POA: Diagnosis not present

## 2019-09-23 DIAGNOSIS — F5101 Primary insomnia: Secondary | ICD-10-CM | POA: Diagnosis not present

## 2019-09-23 DIAGNOSIS — F4323 Adjustment disorder with mixed anxiety and depressed mood: Secondary | ICD-10-CM | POA: Diagnosis not present

## 2019-09-30 DIAGNOSIS — F5101 Primary insomnia: Secondary | ICD-10-CM | POA: Diagnosis not present

## 2019-09-30 DIAGNOSIS — F4323 Adjustment disorder with mixed anxiety and depressed mood: Secondary | ICD-10-CM | POA: Diagnosis not present

## 2019-10-02 DIAGNOSIS — R413 Other amnesia: Secondary | ICD-10-CM | POA: Diagnosis not present

## 2019-10-02 DIAGNOSIS — R4189 Other symptoms and signs involving cognitive functions and awareness: Secondary | ICD-10-CM | POA: Diagnosis not present

## 2019-10-02 DIAGNOSIS — R202 Paresthesia of skin: Secondary | ICD-10-CM | POA: Diagnosis not present

## 2019-10-07 DIAGNOSIS — F4323 Adjustment disorder with mixed anxiety and depressed mood: Secondary | ICD-10-CM | POA: Diagnosis not present

## 2019-10-07 DIAGNOSIS — F5101 Primary insomnia: Secondary | ICD-10-CM | POA: Diagnosis not present

## 2019-10-14 DIAGNOSIS — F5101 Primary insomnia: Secondary | ICD-10-CM | POA: Diagnosis not present

## 2019-10-14 DIAGNOSIS — F4323 Adjustment disorder with mixed anxiety and depressed mood: Secondary | ICD-10-CM | POA: Diagnosis not present

## 2019-10-21 DIAGNOSIS — F5101 Primary insomnia: Secondary | ICD-10-CM | POA: Diagnosis not present

## 2019-10-21 DIAGNOSIS — F411 Generalized anxiety disorder: Secondary | ICD-10-CM | POA: Diagnosis not present

## 2019-10-28 DIAGNOSIS — F5101 Primary insomnia: Secondary | ICD-10-CM | POA: Diagnosis not present

## 2019-10-28 DIAGNOSIS — F411 Generalized anxiety disorder: Secondary | ICD-10-CM | POA: Diagnosis not present

## 2019-11-04 DIAGNOSIS — F411 Generalized anxiety disorder: Secondary | ICD-10-CM | POA: Diagnosis not present

## 2019-11-04 DIAGNOSIS — F5101 Primary insomnia: Secondary | ICD-10-CM | POA: Diagnosis not present

## 2019-11-11 DIAGNOSIS — F5101 Primary insomnia: Secondary | ICD-10-CM | POA: Diagnosis not present

## 2019-11-11 DIAGNOSIS — F411 Generalized anxiety disorder: Secondary | ICD-10-CM | POA: Diagnosis not present

## 2019-11-18 DIAGNOSIS — F5101 Primary insomnia: Secondary | ICD-10-CM | POA: Diagnosis not present

## 2019-11-18 DIAGNOSIS — F411 Generalized anxiety disorder: Secondary | ICD-10-CM | POA: Diagnosis not present

## 2019-11-25 DIAGNOSIS — F411 Generalized anxiety disorder: Secondary | ICD-10-CM | POA: Diagnosis not present

## 2019-11-25 DIAGNOSIS — F5101 Primary insomnia: Secondary | ICD-10-CM | POA: Diagnosis not present

## 2019-12-02 DIAGNOSIS — F411 Generalized anxiety disorder: Secondary | ICD-10-CM | POA: Diagnosis not present

## 2019-12-02 DIAGNOSIS — F5101 Primary insomnia: Secondary | ICD-10-CM | POA: Diagnosis not present

## 2019-12-16 DIAGNOSIS — F411 Generalized anxiety disorder: Secondary | ICD-10-CM | POA: Diagnosis not present

## 2019-12-16 DIAGNOSIS — F5101 Primary insomnia: Secondary | ICD-10-CM | POA: Diagnosis not present

## 2019-12-23 DIAGNOSIS — F411 Generalized anxiety disorder: Secondary | ICD-10-CM | POA: Diagnosis not present

## 2019-12-23 DIAGNOSIS — F5101 Primary insomnia: Secondary | ICD-10-CM | POA: Diagnosis not present

## 2019-12-30 DIAGNOSIS — F411 Generalized anxiety disorder: Secondary | ICD-10-CM | POA: Diagnosis not present

## 2019-12-30 DIAGNOSIS — F5101 Primary insomnia: Secondary | ICD-10-CM | POA: Diagnosis not present

## 2020-01-06 DIAGNOSIS — F5101 Primary insomnia: Secondary | ICD-10-CM | POA: Diagnosis not present

## 2020-01-06 DIAGNOSIS — F411 Generalized anxiety disorder: Secondary | ICD-10-CM | POA: Diagnosis not present

## 2020-01-13 DIAGNOSIS — F5101 Primary insomnia: Secondary | ICD-10-CM | POA: Diagnosis not present

## 2020-01-13 DIAGNOSIS — F411 Generalized anxiety disorder: Secondary | ICD-10-CM | POA: Diagnosis not present

## 2020-01-20 DIAGNOSIS — F411 Generalized anxiety disorder: Secondary | ICD-10-CM | POA: Diagnosis not present

## 2020-01-20 DIAGNOSIS — F5101 Primary insomnia: Secondary | ICD-10-CM | POA: Diagnosis not present

## 2020-01-27 DIAGNOSIS — F411 Generalized anxiety disorder: Secondary | ICD-10-CM | POA: Diagnosis not present

## 2020-01-27 DIAGNOSIS — F5101 Primary insomnia: Secondary | ICD-10-CM | POA: Diagnosis not present

## 2020-01-28 DIAGNOSIS — J029 Acute pharyngitis, unspecified: Secondary | ICD-10-CM | POA: Diagnosis not present

## 2020-02-01 DIAGNOSIS — K120 Recurrent oral aphthae: Secondary | ICD-10-CM | POA: Diagnosis not present

## 2020-02-17 DIAGNOSIS — F411 Generalized anxiety disorder: Secondary | ICD-10-CM | POA: Diagnosis not present

## 2020-02-17 DIAGNOSIS — F5101 Primary insomnia: Secondary | ICD-10-CM | POA: Diagnosis not present

## 2020-03-02 DIAGNOSIS — F411 Generalized anxiety disorder: Secondary | ICD-10-CM | POA: Diagnosis not present

## 2020-03-02 DIAGNOSIS — F5101 Primary insomnia: Secondary | ICD-10-CM | POA: Diagnosis not present

## 2020-03-30 DIAGNOSIS — F411 Generalized anxiety disorder: Secondary | ICD-10-CM | POA: Diagnosis not present

## 2020-03-30 DIAGNOSIS — F5101 Primary insomnia: Secondary | ICD-10-CM | POA: Diagnosis not present

## 2020-04-14 DIAGNOSIS — Z20822 Contact with and (suspected) exposure to covid-19: Secondary | ICD-10-CM | POA: Diagnosis not present

## 2020-04-15 DIAGNOSIS — G4700 Insomnia, unspecified: Secondary | ICD-10-CM | POA: Diagnosis not present

## 2020-04-15 DIAGNOSIS — F410 Panic disorder [episodic paroxysmal anxiety] without agoraphobia: Secondary | ICD-10-CM | POA: Diagnosis not present

## 2020-04-26 DIAGNOSIS — L089 Local infection of the skin and subcutaneous tissue, unspecified: Secondary | ICD-10-CM | POA: Diagnosis not present

## 2020-04-26 DIAGNOSIS — R202 Paresthesia of skin: Secondary | ICD-10-CM | POA: Diagnosis not present

## 2020-04-26 DIAGNOSIS — R5383 Other fatigue: Secondary | ICD-10-CM | POA: Diagnosis not present

## 2020-04-27 DIAGNOSIS — F411 Generalized anxiety disorder: Secondary | ICD-10-CM | POA: Diagnosis not present

## 2020-04-27 DIAGNOSIS — F5101 Primary insomnia: Secondary | ICD-10-CM | POA: Diagnosis not present

## 2020-05-06 DIAGNOSIS — H5319 Other subjective visual disturbances: Secondary | ICD-10-CM | POA: Diagnosis not present

## 2020-05-06 DIAGNOSIS — Z87898 Personal history of other specified conditions: Secondary | ICD-10-CM | POA: Diagnosis not present

## 2020-05-12 ENCOUNTER — Other Ambulatory Visit: Payer: Self-pay | Admitting: Internal Medicine

## 2020-05-12 DIAGNOSIS — R519 Headache, unspecified: Secondary | ICD-10-CM

## 2020-05-14 ENCOUNTER — Ambulatory Visit
Admission: RE | Admit: 2020-05-14 | Discharge: 2020-05-14 | Disposition: A | Discharge status: Home / Self Care | Payer: BC Managed Care – PPO | Source: Ambulatory Visit | Attending: Internal Medicine | Admitting: Internal Medicine

## 2020-05-14 ENCOUNTER — Other Ambulatory Visit: Payer: Self-pay

## 2020-05-14 DIAGNOSIS — R519 Headache, unspecified: Secondary | ICD-10-CM

## 2020-05-14 DIAGNOSIS — Z20822 Contact with and (suspected) exposure to covid-19: Secondary | ICD-10-CM | POA: Diagnosis not present

## 2020-05-14 MED ORDER — GADOBENATE DIMEGLUMINE 529 MG/ML IV SOLN
20.0000 mL | Freq: Once | INTRAVENOUS | Status: AC | PRN
  Administered 2020-05-14: 20 mL via INTRAVENOUS

## 2020-05-16 ENCOUNTER — Telehealth: Payer: Self-pay | Admitting: Unknown Physician Specialty

## 2020-05-16 ENCOUNTER — Other Ambulatory Visit: Payer: Self-pay | Admitting: Unknown Physician Specialty

## 2020-05-16 DIAGNOSIS — U071 COVID-19: Secondary | ICD-10-CM

## 2020-05-16 DIAGNOSIS — E663 Overweight: Secondary | ICD-10-CM

## 2020-05-16 NOTE — Telephone Encounter (Signed)
I connected by phone with Ricky Andersen on 05/16/2020 at 11:58 AM to discuss the potential use of a new treatment for mild to moderate COVID-19 viral infection in non-hospitalized patients.  This patient is a 49 y.o. male that meets the FDA criteria for Emergency Use Authorization of COVID monoclonal antibody casirivimab/imdevimab.  Has a (+) direct SARS-CoV-2 viral test result  Has mild or moderate COVID-19   Is NOT hospitalized due to COVID-19  Is within 10 days of symptom onset  Has at least one of the high risk factor(s) for progression to severe COVID-19 and/or hospitalization as defined in EUA.  Specific high risk criteria : BMI > 25   I have spoken and communicated the following to the patient or parent/caregiver regarding COVID monoclonal antibody treatment:  1. FDA has authorized the emergency use for the treatment of mild to moderate COVID-19 in adults and pediatric patients with positive results of direct SARS-CoV-2 viral testing who are 75 years of age and older weighing at least 40 kg, and who are at high risk for progressing to severe COVID-19 and/or hospitalization.  2. The significant known and potential risks and benefits of COVID monoclonal antibody, and the extent to which such potential risks and benefits are unknown.  3. Information on available alternative treatments and the risks and benefits of those alternatives, including clinical trials.  4. Patients treated with COVID monoclonal antibody should continue to self-isolate and use infection control measures (e.g., wear mask, isolate, social distance, avoid sharing personal items, clean and disinfect high touch surfaces, and frequent handwashing) according to CDC guidelines.   5. The patient or parent/caregiver has the option to accept or refuse COVID monoclonal antibody treatment.  After reviewing this information with the patient, The patient agreed to proceed with receiving casirivimab\imdevimab infusion and  will be provided a copy of the Fact sheet prior to receiving the infusion. Kathrine Haddock 05/16/2020 11:58 AM  Sx onset 9/3

## 2020-05-17 ENCOUNTER — Ambulatory Visit (HOSPITAL_COMMUNITY)
Admission: RE | Admit: 2020-05-17 | Discharge: 2020-05-17 | Disposition: A | Discharge status: Home / Self Care | Payer: BC Managed Care – PPO | Source: Ambulatory Visit | Attending: Pulmonary Disease | Admitting: Pulmonary Disease

## 2020-05-17 DIAGNOSIS — E663 Overweight: Secondary | ICD-10-CM

## 2020-05-17 DIAGNOSIS — U071 COVID-19: Secondary | ICD-10-CM

## 2020-05-17 MED ORDER — DIPHENHYDRAMINE HCL 50 MG/ML IJ SOLN: 50.0000 mg | Freq: Once | INTRAMUSCULAR | Status: DC | PRN

## 2020-05-17 MED ORDER — ALBUTEROL SULFATE HFA 108 (90 BASE) MCG/ACT IN AERS: 2.0000 | INHALATION_SPRAY | Freq: Once | RESPIRATORY_TRACT | Status: DC | PRN

## 2020-05-17 MED ORDER — SODIUM CHLORIDE 0.9 % IV SOLN
1200.0000 mg | Freq: Once | INTRAVENOUS | Status: AC
  Administered 2020-05-17: 1200 mg via INTRAVENOUS
  Filled 2020-05-17: qty 10

## 2020-05-17 MED ORDER — FAMOTIDINE IN NACL 20-0.9 MG/50ML-% IV SOLN: 20.0000 mg | Freq: Once | INTRAVENOUS | Status: DC | PRN

## 2020-05-17 MED ORDER — SODIUM CHLORIDE 0.9 % IV SOLN: INTRAVENOUS | Status: DC | PRN

## 2020-05-17 MED ORDER — EPINEPHRINE 0.3 MG/0.3ML IJ SOAJ: 0.3000 mg | Freq: Once | INTRAMUSCULAR | Status: DC | PRN

## 2020-05-17 MED ORDER — METHYLPREDNISOLONE SODIUM SUCC 125 MG IJ SOLR: 125.0000 mg | Freq: Once | INTRAMUSCULAR | Status: DC | PRN

## 2020-05-17 NOTE — Progress Notes (Signed)
°  Diagnosis: COVID-19  Physician: Dr. Joya Gaskins  Procedure: [facility] Med: casirivimab\imdevimab infusion - Provided patient with casirivimab\imdevimab fact sheet for patients, parents and caregivers prior to infusion.  Complications: No immediate complications noted.  Discharge: Discharged home   Heide Scales 05/17/2020

## 2020-05-17 NOTE — Discharge Instructions (Signed)

## 2020-05-18 ENCOUNTER — Other Ambulatory Visit: Payer: Self-pay | Admitting: Internal Medicine

## 2020-05-18 DIAGNOSIS — F5101 Primary insomnia: Secondary | ICD-10-CM | POA: Diagnosis not present

## 2020-05-18 DIAGNOSIS — F411 Generalized anxiety disorder: Secondary | ICD-10-CM | POA: Diagnosis not present

## 2020-06-01 DIAGNOSIS — F5101 Primary insomnia: Secondary | ICD-10-CM | POA: Diagnosis not present

## 2020-06-01 DIAGNOSIS — F411 Generalized anxiety disorder: Secondary | ICD-10-CM | POA: Diagnosis not present

## 2020-06-15 DIAGNOSIS — F411 Generalized anxiety disorder: Secondary | ICD-10-CM | POA: Diagnosis not present

## 2020-06-15 DIAGNOSIS — F5101 Primary insomnia: Secondary | ICD-10-CM | POA: Diagnosis not present

## 2020-06-29 DIAGNOSIS — F411 Generalized anxiety disorder: Secondary | ICD-10-CM | POA: Diagnosis not present

## 2020-06-29 DIAGNOSIS — F5101 Primary insomnia: Secondary | ICD-10-CM | POA: Diagnosis not present

## 2020-07-13 DIAGNOSIS — F411 Generalized anxiety disorder: Secondary | ICD-10-CM | POA: Diagnosis not present

## 2020-07-13 DIAGNOSIS — F5101 Primary insomnia: Secondary | ICD-10-CM | POA: Diagnosis not present

## 2020-07-21 DIAGNOSIS — H5319 Other subjective visual disturbances: Secondary | ICD-10-CM | POA: Diagnosis not present

## 2020-07-30 DIAGNOSIS — R419 Unspecified symptoms and signs involving cognitive functions and awareness: Secondary | ICD-10-CM | POA: Diagnosis not present

## 2020-07-30 DIAGNOSIS — H539 Unspecified visual disturbance: Secondary | ICD-10-CM | POA: Diagnosis not present

## 2020-07-30 DIAGNOSIS — H9313 Tinnitus, bilateral: Secondary | ICD-10-CM | POA: Diagnosis not present

## 2020-07-30 DIAGNOSIS — G6289 Other specified polyneuropathies: Secondary | ICD-10-CM | POA: Diagnosis not present

## 2020-11-15 DIAGNOSIS — F411 Generalized anxiety disorder: Secondary | ICD-10-CM | POA: Diagnosis not present

## 2020-11-15 DIAGNOSIS — F5101 Primary insomnia: Secondary | ICD-10-CM | POA: Diagnosis not present

## 2020-11-29 DIAGNOSIS — F411 Generalized anxiety disorder: Secondary | ICD-10-CM | POA: Diagnosis not present

## 2020-11-29 DIAGNOSIS — G4700 Insomnia, unspecified: Secondary | ICD-10-CM | POA: Diagnosis not present

## 2020-11-29 DIAGNOSIS — F410 Panic disorder [episodic paroxysmal anxiety] without agoraphobia: Secondary | ICD-10-CM | POA: Diagnosis not present

## 2020-12-13 DIAGNOSIS — F411 Generalized anxiety disorder: Secondary | ICD-10-CM | POA: Diagnosis not present

## 2020-12-13 DIAGNOSIS — F5101 Primary insomnia: Secondary | ICD-10-CM | POA: Diagnosis not present

## 2020-12-20 DIAGNOSIS — R419 Unspecified symptoms and signs involving cognitive functions and awareness: Secondary | ICD-10-CM | POA: Diagnosis not present

## 2020-12-20 DIAGNOSIS — G6289 Other specified polyneuropathies: Secondary | ICD-10-CM | POA: Diagnosis not present

## 2020-12-20 DIAGNOSIS — H9313 Tinnitus, bilateral: Secondary | ICD-10-CM | POA: Diagnosis not present

## 2020-12-20 DIAGNOSIS — H539 Unspecified visual disturbance: Secondary | ICD-10-CM | POA: Diagnosis not present

## 2021-01-04 DIAGNOSIS — F411 Generalized anxiety disorder: Secondary | ICD-10-CM | POA: Diagnosis not present

## 2021-01-04 DIAGNOSIS — F5101 Primary insomnia: Secondary | ICD-10-CM | POA: Diagnosis not present

## 2021-01-10 DIAGNOSIS — F411 Generalized anxiety disorder: Secondary | ICD-10-CM | POA: Diagnosis not present

## 2021-01-10 DIAGNOSIS — F5101 Primary insomnia: Secondary | ICD-10-CM | POA: Diagnosis not present

## 2021-01-24 DIAGNOSIS — F411 Generalized anxiety disorder: Secondary | ICD-10-CM | POA: Diagnosis not present

## 2021-01-24 DIAGNOSIS — F5101 Primary insomnia: Secondary | ICD-10-CM | POA: Diagnosis not present

## 2021-02-08 DIAGNOSIS — R202 Paresthesia of skin: Secondary | ICD-10-CM | POA: Diagnosis not present

## 2021-02-10 DIAGNOSIS — M9901 Segmental and somatic dysfunction of cervical region: Secondary | ICD-10-CM | POA: Diagnosis not present

## 2021-02-10 DIAGNOSIS — M5411 Radiculopathy, occipito-atlanto-axial region: Secondary | ICD-10-CM | POA: Diagnosis not present

## 2021-02-14 DIAGNOSIS — F5101 Primary insomnia: Secondary | ICD-10-CM | POA: Diagnosis not present

## 2021-02-14 DIAGNOSIS — M5411 Radiculopathy, occipito-atlanto-axial region: Secondary | ICD-10-CM | POA: Diagnosis not present

## 2021-02-14 DIAGNOSIS — M9901 Segmental and somatic dysfunction of cervical region: Secondary | ICD-10-CM | POA: Diagnosis not present

## 2021-02-14 DIAGNOSIS — F411 Generalized anxiety disorder: Secondary | ICD-10-CM | POA: Diagnosis not present

## 2021-02-16 DIAGNOSIS — H5330 Unspecified disorder of binocular vision: Secondary | ICD-10-CM | POA: Diagnosis not present

## 2021-02-16 DIAGNOSIS — R278 Other lack of coordination: Secondary | ICD-10-CM | POA: Diagnosis not present

## 2021-02-16 DIAGNOSIS — H5052 Exophoria: Secondary | ICD-10-CM | POA: Diagnosis not present

## 2021-02-16 DIAGNOSIS — H53483 Generalized contraction of visual field, bilateral: Secondary | ICD-10-CM | POA: Diagnosis not present

## 2021-02-17 DIAGNOSIS — M9901 Segmental and somatic dysfunction of cervical region: Secondary | ICD-10-CM | POA: Diagnosis not present

## 2021-02-17 DIAGNOSIS — M5411 Radiculopathy, occipito-atlanto-axial region: Secondary | ICD-10-CM | POA: Diagnosis not present

## 2021-02-21 DIAGNOSIS — F5101 Primary insomnia: Secondary | ICD-10-CM | POA: Diagnosis not present

## 2021-02-21 DIAGNOSIS — M5411 Radiculopathy, occipito-atlanto-axial region: Secondary | ICD-10-CM | POA: Diagnosis not present

## 2021-02-21 DIAGNOSIS — M9901 Segmental and somatic dysfunction of cervical region: Secondary | ICD-10-CM | POA: Diagnosis not present

## 2021-02-21 DIAGNOSIS — F411 Generalized anxiety disorder: Secondary | ICD-10-CM | POA: Diagnosis not present

## 2021-02-23 DIAGNOSIS — M9901 Segmental and somatic dysfunction of cervical region: Secondary | ICD-10-CM | POA: Diagnosis not present

## 2021-02-23 DIAGNOSIS — M5411 Radiculopathy, occipito-atlanto-axial region: Secondary | ICD-10-CM | POA: Diagnosis not present

## 2021-02-28 DIAGNOSIS — F411 Generalized anxiety disorder: Secondary | ICD-10-CM | POA: Diagnosis not present

## 2021-02-28 DIAGNOSIS — F5101 Primary insomnia: Secondary | ICD-10-CM | POA: Diagnosis not present

## 2021-03-08 DIAGNOSIS — M9901 Segmental and somatic dysfunction of cervical region: Secondary | ICD-10-CM | POA: Diagnosis not present

## 2021-03-08 DIAGNOSIS — M5411 Radiculopathy, occipito-atlanto-axial region: Secondary | ICD-10-CM | POA: Diagnosis not present

## 2021-03-08 DIAGNOSIS — R202 Paresthesia of skin: Secondary | ICD-10-CM | POA: Diagnosis not present

## 2021-03-08 DIAGNOSIS — R29818 Other symptoms and signs involving the nervous system: Secondary | ICD-10-CM | POA: Diagnosis not present

## 2021-03-10 DIAGNOSIS — M5481 Occipital neuralgia: Secondary | ICD-10-CM | POA: Diagnosis not present

## 2021-03-15 ENCOUNTER — Other Ambulatory Visit: Payer: Self-pay | Admitting: Family Medicine

## 2021-03-15 DIAGNOSIS — M5481 Occipital neuralgia: Secondary | ICD-10-CM

## 2021-03-19 DIAGNOSIS — F411 Generalized anxiety disorder: Secondary | ICD-10-CM | POA: Diagnosis not present

## 2021-03-19 DIAGNOSIS — F4322 Adjustment disorder with anxiety: Secondary | ICD-10-CM | POA: Diagnosis not present

## 2021-03-19 DIAGNOSIS — G4700 Insomnia, unspecified: Secondary | ICD-10-CM | POA: Diagnosis not present

## 2021-03-20 ENCOUNTER — Other Ambulatory Visit: Payer: BC Managed Care – PPO

## 2021-03-22 ENCOUNTER — Ambulatory Visit
Admission: RE | Admit: 2021-03-22 | Discharge: 2021-03-22 | Disposition: A | Discharge status: Home / Self Care | Payer: BC Managed Care – PPO | Source: Ambulatory Visit | Attending: Family Medicine | Admitting: Family Medicine

## 2021-03-22 DIAGNOSIS — M5481 Occipital neuralgia: Secondary | ICD-10-CM | POA: Diagnosis not present

## 2021-03-22 MED ORDER — GADOBENATE DIMEGLUMINE 529 MG/ML IV SOLN
19.0000 mL | Freq: Once | INTRAVENOUS | Status: AC | PRN
  Administered 2021-03-22: 19 mL via INTRAVENOUS

## 2021-03-23 DIAGNOSIS — F5101 Primary insomnia: Secondary | ICD-10-CM | POA: Diagnosis not present

## 2021-03-23 DIAGNOSIS — F411 Generalized anxiety disorder: Secondary | ICD-10-CM | POA: Diagnosis not present

## 2021-03-25 DIAGNOSIS — H9313 Tinnitus, bilateral: Secondary | ICD-10-CM | POA: Diagnosis not present

## 2021-03-25 DIAGNOSIS — G6289 Other specified polyneuropathies: Secondary | ICD-10-CM | POA: Diagnosis not present

## 2021-03-25 DIAGNOSIS — B379 Candidiasis, unspecified: Secondary | ICD-10-CM | POA: Diagnosis not present

## 2021-03-25 DIAGNOSIS — R140 Abdominal distension (gaseous): Secondary | ICD-10-CM | POA: Diagnosis not present

## 2021-04-04 DIAGNOSIS — R202 Paresthesia of skin: Secondary | ICD-10-CM | POA: Diagnosis not present

## 2021-04-04 DIAGNOSIS — G4452 New daily persistent headache (NDPH): Secondary | ICD-10-CM | POA: Diagnosis not present

## 2021-04-04 DIAGNOSIS — F411 Generalized anxiety disorder: Secondary | ICD-10-CM | POA: Diagnosis not present

## 2021-04-04 DIAGNOSIS — M5481 Occipital neuralgia: Secondary | ICD-10-CM | POA: Diagnosis not present

## 2021-04-04 DIAGNOSIS — F5101 Primary insomnia: Secondary | ICD-10-CM | POA: Diagnosis not present

## 2021-04-06 DIAGNOSIS — M47892 Other spondylosis, cervical region: Secondary | ICD-10-CM | POA: Diagnosis not present

## 2021-04-06 DIAGNOSIS — M4602 Spinal enthesopathy, cervical region: Secondary | ICD-10-CM | POA: Diagnosis not present

## 2021-04-06 DIAGNOSIS — M2578 Osteophyte, vertebrae: Secondary | ICD-10-CM | POA: Diagnosis not present

## 2021-04-06 DIAGNOSIS — M47812 Spondylosis without myelopathy or radiculopathy, cervical region: Secondary | ICD-10-CM | POA: Diagnosis not present

## 2021-04-18 DIAGNOSIS — F5101 Primary insomnia: Secondary | ICD-10-CM | POA: Diagnosis not present

## 2021-04-18 DIAGNOSIS — F411 Generalized anxiety disorder: Secondary | ICD-10-CM | POA: Diagnosis not present

## 2021-04-19 DIAGNOSIS — M5481 Occipital neuralgia: Secondary | ICD-10-CM | POA: Diagnosis not present

## 2021-04-20 DIAGNOSIS — H5052 Exophoria: Secondary | ICD-10-CM | POA: Diagnosis not present

## 2021-04-20 DIAGNOSIS — H5330 Unspecified disorder of binocular vision: Secondary | ICD-10-CM | POA: Diagnosis not present

## 2021-04-20 DIAGNOSIS — H53483 Generalized contraction of visual field, bilateral: Secondary | ICD-10-CM | POA: Diagnosis not present

## 2021-04-27 DIAGNOSIS — H538 Other visual disturbances: Secondary | ICD-10-CM | POA: Diagnosis not present

## 2021-04-27 DIAGNOSIS — M5481 Occipital neuralgia: Secondary | ICD-10-CM | POA: Diagnosis not present

## 2021-04-27 DIAGNOSIS — R202 Paresthesia of skin: Secondary | ICD-10-CM | POA: Diagnosis not present

## 2021-04-27 DIAGNOSIS — R419 Unspecified symptoms and signs involving cognitive functions and awareness: Secondary | ICD-10-CM | POA: Diagnosis not present

## 2021-05-02 DIAGNOSIS — F5101 Primary insomnia: Secondary | ICD-10-CM | POA: Diagnosis not present

## 2021-05-02 DIAGNOSIS — F411 Generalized anxiety disorder: Secondary | ICD-10-CM | POA: Diagnosis not present

## 2021-05-12 DIAGNOSIS — G379 Demyelinating disease of central nervous system, unspecified: Secondary | ICD-10-CM | POA: Diagnosis not present

## 2021-05-18 DIAGNOSIS — F411 Generalized anxiety disorder: Secondary | ICD-10-CM | POA: Diagnosis not present

## 2021-05-18 DIAGNOSIS — F5101 Primary insomnia: Secondary | ICD-10-CM | POA: Diagnosis not present

## 2021-05-24 DIAGNOSIS — M5134 Other intervertebral disc degeneration, thoracic region: Secondary | ICD-10-CM | POA: Diagnosis not present

## 2021-05-24 DIAGNOSIS — M545 Low back pain, unspecified: Secondary | ICD-10-CM | POA: Diagnosis not present

## 2021-05-24 DIAGNOSIS — M5124 Other intervertebral disc displacement, thoracic region: Secondary | ICD-10-CM | POA: Diagnosis not present

## 2021-05-24 DIAGNOSIS — M47812 Spondylosis without myelopathy or radiculopathy, cervical region: Secondary | ICD-10-CM | POA: Diagnosis not present

## 2021-05-24 DIAGNOSIS — R202 Paresthesia of skin: Secondary | ICD-10-CM | POA: Diagnosis not present

## 2021-05-24 DIAGNOSIS — M5116 Intervertebral disc disorders with radiculopathy, lumbar region: Secondary | ICD-10-CM | POA: Diagnosis not present

## 2021-05-24 DIAGNOSIS — S22080A Wedge compression fracture of T11-T12 vertebra, initial encounter for closed fracture: Secondary | ICD-10-CM | POA: Diagnosis not present

## 2021-05-24 DIAGNOSIS — G379 Demyelinating disease of central nervous system, unspecified: Secondary | ICD-10-CM | POA: Diagnosis not present

## 2021-05-24 DIAGNOSIS — M4802 Spinal stenosis, cervical region: Secondary | ICD-10-CM | POA: Diagnosis not present

## 2021-05-24 DIAGNOSIS — M5125 Other intervertebral disc displacement, thoracolumbar region: Secondary | ICD-10-CM | POA: Diagnosis not present

## 2021-05-27 DIAGNOSIS — M5416 Radiculopathy, lumbar region: Secondary | ICD-10-CM | POA: Diagnosis not present

## 2021-05-27 DIAGNOSIS — M5481 Occipital neuralgia: Secondary | ICD-10-CM | POA: Diagnosis not present

## 2021-05-27 DIAGNOSIS — R202 Paresthesia of skin: Secondary | ICD-10-CM | POA: Diagnosis not present

## 2021-05-30 DIAGNOSIS — F5101 Primary insomnia: Secondary | ICD-10-CM | POA: Diagnosis not present

## 2021-05-30 DIAGNOSIS — F411 Generalized anxiety disorder: Secondary | ICD-10-CM | POA: Diagnosis not present

## 2021-06-13 DIAGNOSIS — F5101 Primary insomnia: Secondary | ICD-10-CM | POA: Diagnosis not present

## 2021-06-13 DIAGNOSIS — F411 Generalized anxiety disorder: Secondary | ICD-10-CM | POA: Diagnosis not present

## 2021-07-11 DIAGNOSIS — F5101 Primary insomnia: Secondary | ICD-10-CM | POA: Diagnosis not present

## 2021-07-11 DIAGNOSIS — F411 Generalized anxiety disorder: Secondary | ICD-10-CM | POA: Diagnosis not present

## 2021-07-12 DIAGNOSIS — G379 Demyelinating disease of central nervous system, unspecified: Secondary | ICD-10-CM | POA: Diagnosis not present

## 2021-07-12 DIAGNOSIS — R202 Paresthesia of skin: Secondary | ICD-10-CM | POA: Diagnosis not present

## 2021-08-08 DIAGNOSIS — F5101 Primary insomnia: Secondary | ICD-10-CM | POA: Diagnosis not present

## 2021-08-08 DIAGNOSIS — F411 Generalized anxiety disorder: Secondary | ICD-10-CM | POA: Diagnosis not present

## 2021-08-16 DIAGNOSIS — G4700 Insomnia, unspecified: Secondary | ICD-10-CM | POA: Diagnosis not present

## 2021-08-16 DIAGNOSIS — F411 Generalized anxiety disorder: Secondary | ICD-10-CM | POA: Diagnosis not present

## 2021-08-19 DIAGNOSIS — K089 Disorder of teeth and supporting structures, unspecified: Secondary | ICD-10-CM | POA: Diagnosis not present

## 2021-08-19 DIAGNOSIS — R140 Abdominal distension (gaseous): Secondary | ICD-10-CM | POA: Diagnosis not present

## 2021-08-19 DIAGNOSIS — B379 Candidiasis, unspecified: Secondary | ICD-10-CM | POA: Diagnosis not present

## 2021-08-19 DIAGNOSIS — Z77120 Contact with and (suspected) exposure to mold (toxic): Secondary | ICD-10-CM | POA: Diagnosis not present

## 2021-08-22 DIAGNOSIS — F5101 Primary insomnia: Secondary | ICD-10-CM | POA: Diagnosis not present

## 2021-08-22 DIAGNOSIS — F411 Generalized anxiety disorder: Secondary | ICD-10-CM | POA: Diagnosis not present

## 2021-09-27 DIAGNOSIS — M79605 Pain in left leg: Secondary | ICD-10-CM | POA: Diagnosis not present

## 2021-09-27 DIAGNOSIS — M79604 Pain in right leg: Secondary | ICD-10-CM | POA: Diagnosis not present

## 2021-09-28 DIAGNOSIS — H579 Unspecified disorder of eye and adnexa: Secondary | ICD-10-CM | POA: Diagnosis not present

## 2021-09-28 DIAGNOSIS — M79605 Pain in left leg: Secondary | ICD-10-CM | POA: Diagnosis not present

## 2021-09-28 DIAGNOSIS — R202 Paresthesia of skin: Secondary | ICD-10-CM | POA: Diagnosis not present

## 2021-09-29 DIAGNOSIS — M5481 Occipital neuralgia: Secondary | ICD-10-CM | POA: Diagnosis not present

## 2021-09-30 DIAGNOSIS — M79604 Pain in right leg: Secondary | ICD-10-CM | POA: Diagnosis not present

## 2021-09-30 DIAGNOSIS — M79605 Pain in left leg: Secondary | ICD-10-CM | POA: Diagnosis not present

## 2021-10-04 DIAGNOSIS — M79604 Pain in right leg: Secondary | ICD-10-CM | POA: Diagnosis not present

## 2021-10-04 DIAGNOSIS — M79605 Pain in left leg: Secondary | ICD-10-CM | POA: Diagnosis not present

## 2021-10-06 DIAGNOSIS — M79604 Pain in right leg: Secondary | ICD-10-CM | POA: Diagnosis not present

## 2021-10-06 DIAGNOSIS — M79605 Pain in left leg: Secondary | ICD-10-CM | POA: Diagnosis not present

## 2021-10-12 DIAGNOSIS — F411 Generalized anxiety disorder: Secondary | ICD-10-CM | POA: Diagnosis not present

## 2021-10-12 DIAGNOSIS — F5101 Primary insomnia: Secondary | ICD-10-CM | POA: Diagnosis not present

## 2021-10-21 DIAGNOSIS — R202 Paresthesia of skin: Secondary | ICD-10-CM | POA: Diagnosis not present

## 2021-10-21 DIAGNOSIS — G8929 Other chronic pain: Secondary | ICD-10-CM | POA: Diagnosis not present

## 2021-10-21 DIAGNOSIS — M549 Dorsalgia, unspecified: Secondary | ICD-10-CM | POA: Diagnosis not present

## 2021-10-25 DIAGNOSIS — M25559 Pain in unspecified hip: Secondary | ICD-10-CM | POA: Diagnosis not present

## 2021-10-25 DIAGNOSIS — M5416 Radiculopathy, lumbar region: Secondary | ICD-10-CM | POA: Diagnosis not present

## 2021-10-26 DIAGNOSIS — F5101 Primary insomnia: Secondary | ICD-10-CM | POA: Diagnosis not present

## 2021-10-26 DIAGNOSIS — F411 Generalized anxiety disorder: Secondary | ICD-10-CM | POA: Diagnosis not present

## 2021-10-28 DIAGNOSIS — R202 Paresthesia of skin: Secondary | ICD-10-CM | POA: Diagnosis not present

## 2021-10-28 DIAGNOSIS — M5416 Radiculopathy, lumbar region: Secondary | ICD-10-CM | POA: Diagnosis not present

## 2021-10-28 DIAGNOSIS — M5481 Occipital neuralgia: Secondary | ICD-10-CM | POA: Diagnosis not present

## 2021-11-02 DIAGNOSIS — M5416 Radiculopathy, lumbar region: Secondary | ICD-10-CM | POA: Diagnosis not present

## 2021-11-02 DIAGNOSIS — R5383 Other fatigue: Secondary | ICD-10-CM | POA: Diagnosis not present

## 2021-11-02 DIAGNOSIS — E559 Vitamin D deficiency, unspecified: Secondary | ICD-10-CM | POA: Diagnosis not present

## 2021-11-02 DIAGNOSIS — G629 Polyneuropathy, unspecified: Secondary | ICD-10-CM | POA: Diagnosis not present

## 2021-11-02 DIAGNOSIS — E538 Deficiency of other specified B group vitamins: Secondary | ICD-10-CM | POA: Diagnosis not present

## 2021-11-02 DIAGNOSIS — R790 Abnormal level of blood mineral: Secondary | ICD-10-CM | POA: Diagnosis not present

## 2021-11-02 DIAGNOSIS — E291 Testicular hypofunction: Secondary | ICD-10-CM | POA: Diagnosis not present

## 2021-11-03 DIAGNOSIS — F5101 Primary insomnia: Secondary | ICD-10-CM | POA: Diagnosis not present

## 2021-11-03 DIAGNOSIS — F411 Generalized anxiety disorder: Secondary | ICD-10-CM | POA: Diagnosis not present

## 2021-11-08 DIAGNOSIS — F3289 Other specified depressive episodes: Secondary | ICD-10-CM | POA: Diagnosis not present

## 2021-11-08 DIAGNOSIS — F411 Generalized anxiety disorder: Secondary | ICD-10-CM | POA: Diagnosis not present

## 2021-11-08 DIAGNOSIS — F5101 Primary insomnia: Secondary | ICD-10-CM | POA: Diagnosis not present

## 2021-11-09 DIAGNOSIS — M5416 Radiculopathy, lumbar region: Secondary | ICD-10-CM | POA: Diagnosis not present

## 2021-11-15 DIAGNOSIS — F5101 Primary insomnia: Secondary | ICD-10-CM | POA: Diagnosis not present

## 2021-11-15 DIAGNOSIS — F411 Generalized anxiety disorder: Secondary | ICD-10-CM | POA: Diagnosis not present

## 2021-11-17 DIAGNOSIS — F09 Unspecified mental disorder due to known physiological condition: Secondary | ICD-10-CM | POA: Diagnosis not present

## 2021-11-17 DIAGNOSIS — H469 Unspecified optic neuritis: Secondary | ICD-10-CM | POA: Diagnosis not present

## 2021-11-17 DIAGNOSIS — H538 Other visual disturbances: Secondary | ICD-10-CM | POA: Diagnosis not present

## 2021-11-17 DIAGNOSIS — G959 Disease of spinal cord, unspecified: Secondary | ICD-10-CM | POA: Diagnosis not present

## 2021-11-17 DIAGNOSIS — M792 Neuralgia and neuritis, unspecified: Secondary | ICD-10-CM | POA: Diagnosis not present

## 2021-11-17 DIAGNOSIS — M62838 Other muscle spasm: Secondary | ICD-10-CM | POA: Diagnosis not present

## 2021-11-28 DIAGNOSIS — R5383 Other fatigue: Secondary | ICD-10-CM | POA: Diagnosis not present

## 2021-11-28 DIAGNOSIS — E291 Testicular hypofunction: Secondary | ICD-10-CM | POA: Diagnosis not present

## 2021-11-28 DIAGNOSIS — G629 Polyneuropathy, unspecified: Secondary | ICD-10-CM | POA: Diagnosis not present

## 2021-11-28 DIAGNOSIS — Z77120 Contact with and (suspected) exposure to mold (toxic): Secondary | ICD-10-CM | POA: Diagnosis not present

## 2021-11-30 DIAGNOSIS — M5416 Radiculopathy, lumbar region: Secondary | ICD-10-CM | POA: Diagnosis not present

## 2021-12-01 DIAGNOSIS — F411 Generalized anxiety disorder: Secondary | ICD-10-CM | POA: Diagnosis not present

## 2021-12-01 DIAGNOSIS — F5101 Primary insomnia: Secondary | ICD-10-CM | POA: Diagnosis not present

## 2021-12-06 IMAGING — MR MR HEAD WO/W CM
11 series · 48 of 48 positions shown · IV contrast (multihance)
Comparison: 05/14/2020

CLINICAL DATA: Occipital neuralgia of right-side

EXAM:
MRI HEAD WITHOUT AND WITH CONTRAST
TECHNIQUE: Multiplanar, multiecho pulse sequences of the brain and surrounding
structures were obtained without and with intravenous contrast.
CONTRAST:  19mL MULTIHANCE GADOBENATE DIMEGLUMINE 529 MG/ML IV SOLN

[Series 5: T1 · sagittal · 4.0mm · 0.75mm/px · 1 of 31 slices shown (1 of 3)]
[im 1/31]
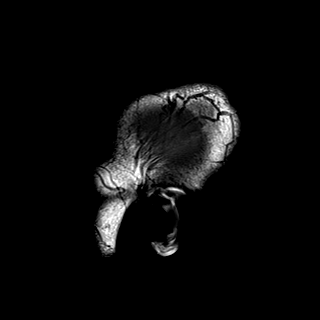

[Series 6: DWI · axial · 3.0mm · 0.94mm/px · z∈[-71,+76]mm · 10 of 168 slices shown]
[im 1/168]
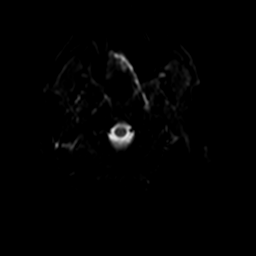
[im 19/168]
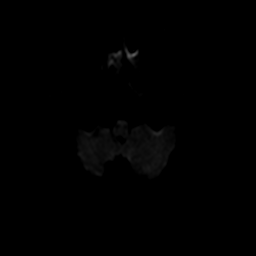
[im 38/168]
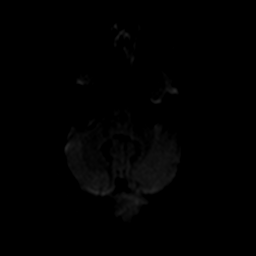
[im 56/168]
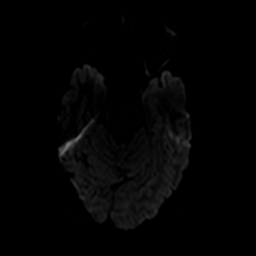
[im 75/168]
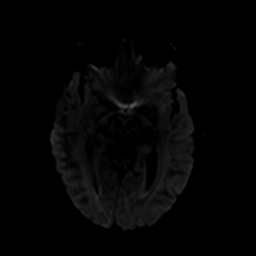
[im 93/168]
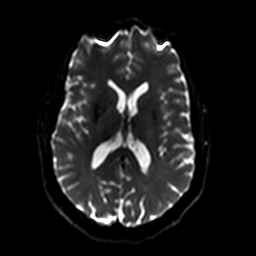
[im 112/168]
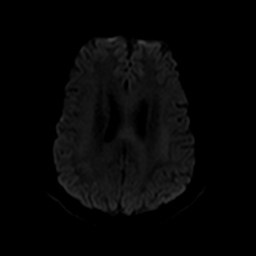
[im 130/168]
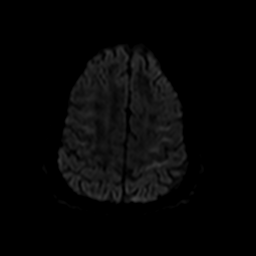
[im 149/168]
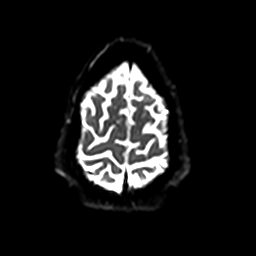
[im 168/168]
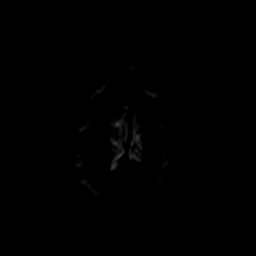

[Series 7: ax dwi_tracew · axial · 3.0mm · 0.94mm/px · z∈[-71,+76]mm · 5 of 84 slices shown]
[im 1/84]
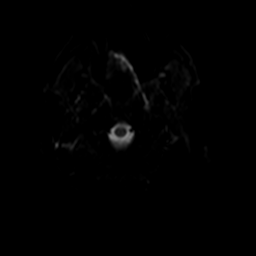
[im 21/84]
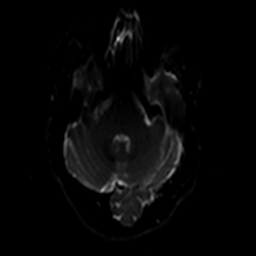
[im 42/84]
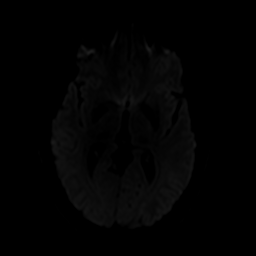
[im 63/84]
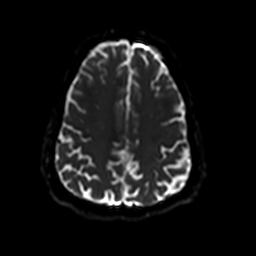
[im 84/84]
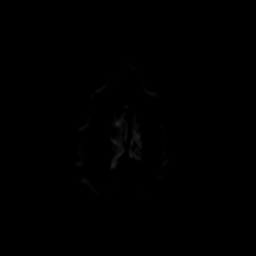

[Series 8: ax dwi_adc · axial · 3.0mm · 0.94mm/px · z∈[-71,+76]mm · 2 of 42 slices shown]
[im 1/42]
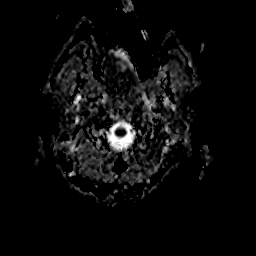
[im 42/42]
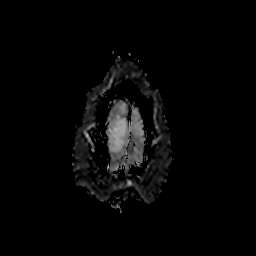

[Series 9: T2 · axial · 4.0mm · 0.36mm/px · z∈[-73,+83]mm · 2 of 29 slices shown]
[im 1/29]
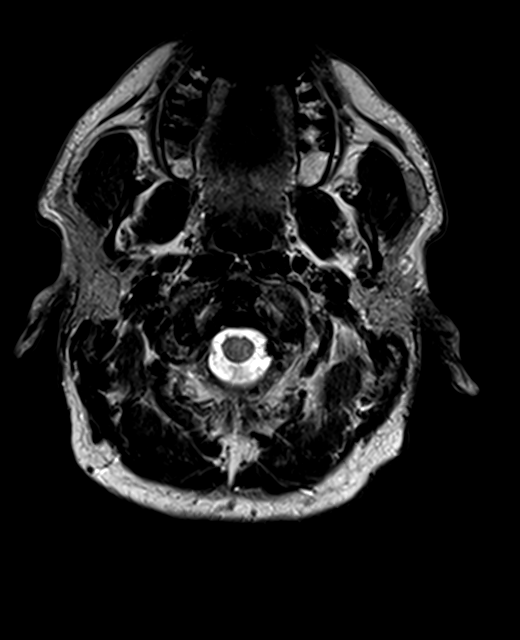
[im 29/29]
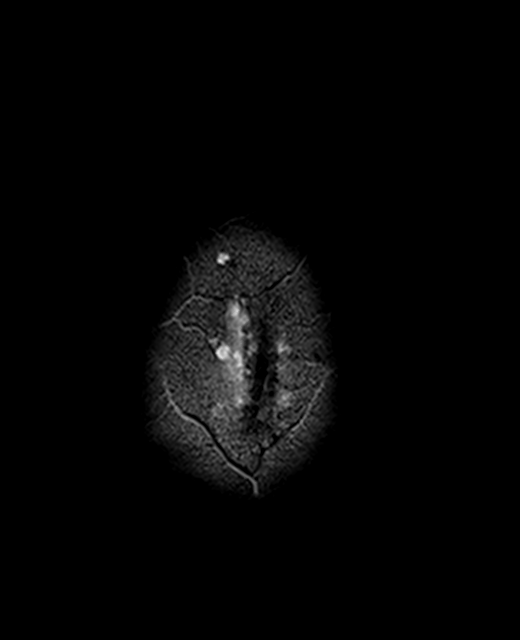

[Series 10: FLAIR · axial · 3.0mm · 0.72mm/px · z∈[-75,+81]mm · 2 of 27 slices shown]
[im 1/27]
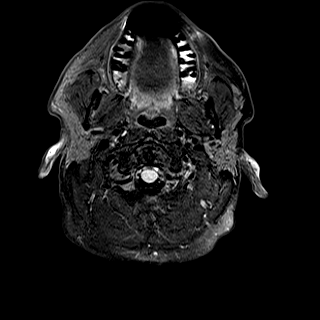
[im 27/27]
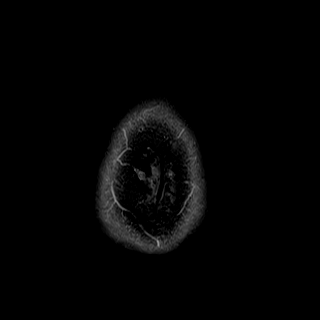

[Series 11: swi_images · axial · 2.2mm · 0.90mm/px · z∈[-73,+83]mm · 4 of 72 slices shown]
[im 1/72]
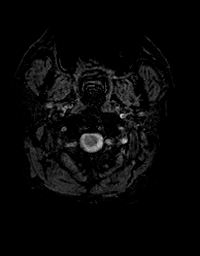
[im 24/72]
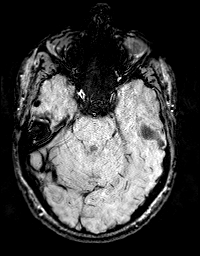
[im 48/72]
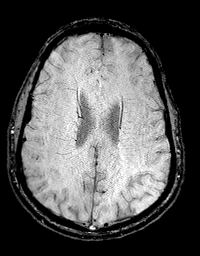
[im 72/72]
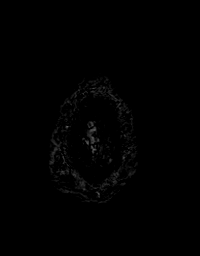

[Series 13: T1 · axial · 1.0mm · 0.94mm/px · z∈[-74,+85]mm · 9 of 160 slices shown (2 of 3)]
[im 1/160]
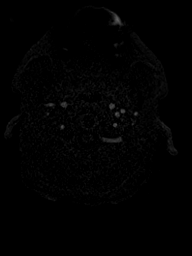
[im 20/160]
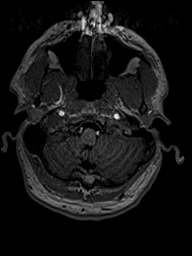
[im 40/160]
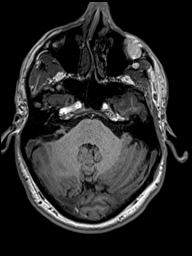
[im 60/160]
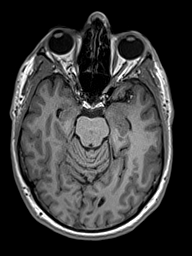
[im 80/160]
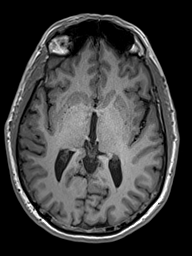
[im 100/160]
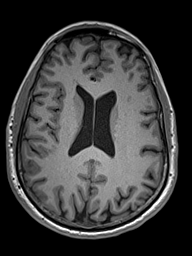
[im 120/160]
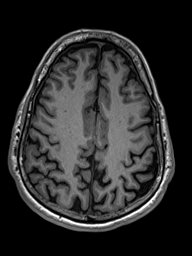
[im 140/160]
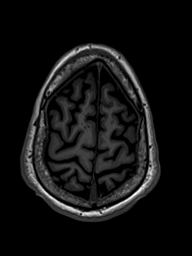
[im 160/160]
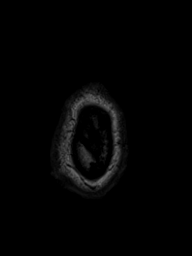

[Series 14: T2 post-contrast · coronal · 4.5mm · 0.36mm/px · 2 of 32 slices shown]
[im 1/32]
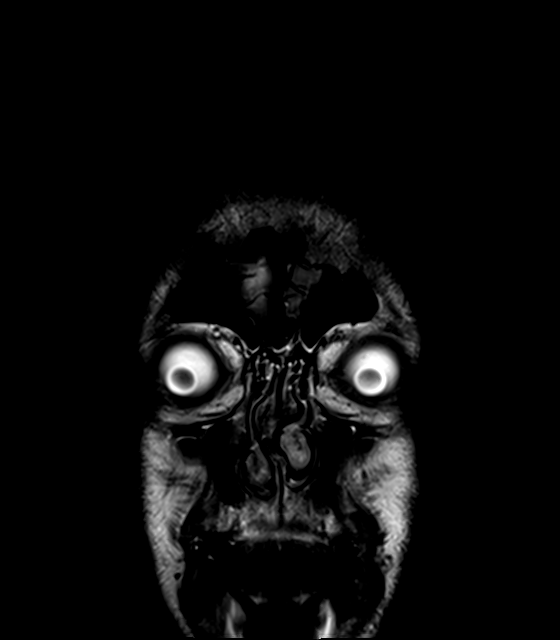
[im 32/32]
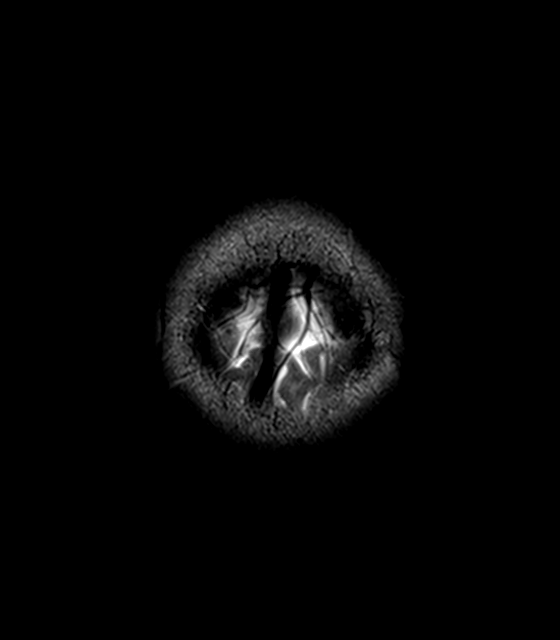

[Series 15: T1 · axial · 1.0mm · 0.94mm/px · z∈[-74,+85]mm · 9 of 160 slices shown (3 of 3)]
[im 1/160]
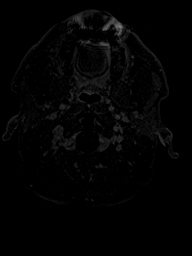
[im 20/160]
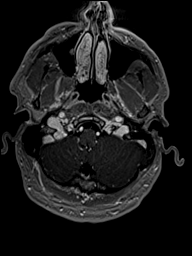
[im 40/160]
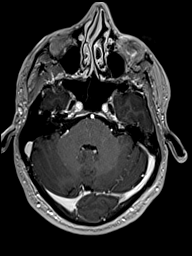
[im 60/160]
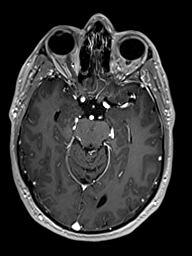
[im 80/160]
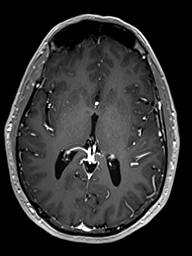
[im 100/160]
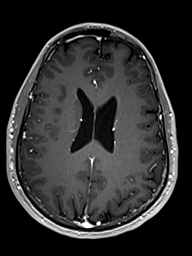
[im 120/160]
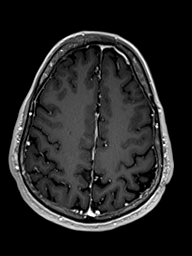
[im 140/160]
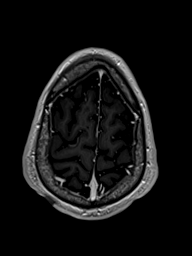
[im 160/160]
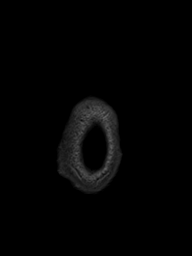

[Series 16: T1 post-contrast · coronal · 4.5mm · 0.72mm/px · 2 of 32 slices shown]
[im 1/32]
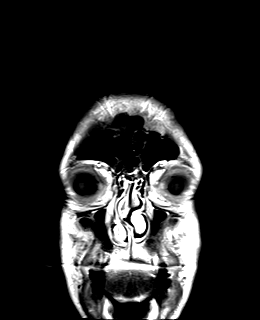
[im 32/32]
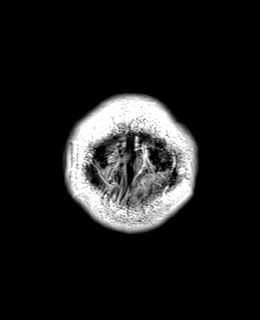

[48 of 48 positions shown; findings below may reference images not displayed]

FINDINGS: Brain: There is no acute infarction or intracranial hemorrhage.
There is no intracranial mass, mass effect, or edema. There is no
hydrocephalus or extra-axial fluid collection. Ventricles and sulci
are normal in size and configuration. No abnormal enhancement.

Vascular: Major vessel flow voids at the skull base are preserved.

Skull and upper cervical spine: Normal marrow signal is preserved.

Sinuses/Orbits: Mild mucosal thickening.  Orbits are unremarkable.

Other: Sella is unremarkable.  Trace mastoid fluid opacification.
IMPRESSION: No acute or significant intracranial abnormality.

## 2021-12-13 DIAGNOSIS — G608 Other hereditary and idiopathic neuropathies: Secondary | ICD-10-CM | POA: Diagnosis not present

## 2021-12-22 DIAGNOSIS — H5319 Other subjective visual disturbances: Secondary | ICD-10-CM | POA: Diagnosis not present

## 2021-12-22 DIAGNOSIS — H538 Other visual disturbances: Secondary | ICD-10-CM | POA: Diagnosis not present

## 2021-12-22 DIAGNOSIS — G608 Other hereditary and idiopathic neuropathies: Secondary | ICD-10-CM | POA: Diagnosis not present

## 2021-12-27 DIAGNOSIS — R202 Paresthesia of skin: Secondary | ICD-10-CM | POA: Diagnosis not present

## 2021-12-27 DIAGNOSIS — H5359 Other color vision deficiencies: Secondary | ICD-10-CM | POA: Diagnosis not present

## 2021-12-27 DIAGNOSIS — G0490 Encephalitis and encephalomyelitis, unspecified: Secondary | ICD-10-CM | POA: Diagnosis not present

## 2021-12-29 DIAGNOSIS — G629 Polyneuropathy, unspecified: Secondary | ICD-10-CM | POA: Diagnosis not present

## 2021-12-30 DIAGNOSIS — R202 Paresthesia of skin: Secondary | ICD-10-CM | POA: Diagnosis not present

## 2021-12-31 DIAGNOSIS — G608 Other hereditary and idiopathic neuropathies: Secondary | ICD-10-CM | POA: Diagnosis not present

## 2022-01-17 DIAGNOSIS — H5319 Other subjective visual disturbances: Secondary | ICD-10-CM | POA: Diagnosis not present

## 2022-01-24 DIAGNOSIS — F5101 Primary insomnia: Secondary | ICD-10-CM | POA: Diagnosis not present

## 2022-01-24 DIAGNOSIS — F411 Generalized anxiety disorder: Secondary | ICD-10-CM | POA: Diagnosis not present

## 2022-02-08 DIAGNOSIS — H53483 Generalized contraction of visual field, bilateral: Secondary | ICD-10-CM | POA: Diagnosis not present

## 2022-02-08 DIAGNOSIS — H04123 Dry eye syndrome of bilateral lacrimal glands: Secondary | ICD-10-CM | POA: Diagnosis not present

## 2022-02-08 DIAGNOSIS — H5052 Exophoria: Secondary | ICD-10-CM | POA: Diagnosis not present

## 2022-02-08 DIAGNOSIS — H02834 Dermatochalasis of left upper eyelid: Secondary | ICD-10-CM | POA: Diagnosis not present

## 2022-02-09 DIAGNOSIS — F411 Generalized anxiety disorder: Secondary | ICD-10-CM | POA: Diagnosis not present

## 2022-02-09 DIAGNOSIS — F5101 Primary insomnia: Secondary | ICD-10-CM | POA: Diagnosis not present

## 2022-02-13 DIAGNOSIS — Z77120 Contact with and (suspected) exposure to mold (toxic): Secondary | ICD-10-CM | POA: Diagnosis not present

## 2022-02-13 DIAGNOSIS — M542 Cervicalgia: Secondary | ICD-10-CM | POA: Diagnosis not present

## 2022-02-13 DIAGNOSIS — R5383 Other fatigue: Secondary | ICD-10-CM | POA: Diagnosis not present

## 2022-02-13 DIAGNOSIS — G629 Polyneuropathy, unspecified: Secondary | ICD-10-CM | POA: Diagnosis not present

## 2022-02-13 DIAGNOSIS — E291 Testicular hypofunction: Secondary | ICD-10-CM | POA: Diagnosis not present

## 2022-02-13 DIAGNOSIS — M546 Pain in thoracic spine: Secondary | ICD-10-CM | POA: Diagnosis not present

## 2022-02-15 DIAGNOSIS — F411 Generalized anxiety disorder: Secondary | ICD-10-CM | POA: Diagnosis not present

## 2022-02-15 DIAGNOSIS — F330 Major depressive disorder, recurrent, mild: Secondary | ICD-10-CM | POA: Diagnosis not present

## 2022-02-15 DIAGNOSIS — F5101 Primary insomnia: Secondary | ICD-10-CM | POA: Diagnosis not present

## 2022-02-27 DIAGNOSIS — R5383 Other fatigue: Secondary | ICD-10-CM | POA: Diagnosis not present

## 2022-02-27 DIAGNOSIS — Z77120 Contact with and (suspected) exposure to mold (toxic): Secondary | ICD-10-CM | POA: Diagnosis not present

## 2022-02-27 DIAGNOSIS — E291 Testicular hypofunction: Secondary | ICD-10-CM | POA: Diagnosis not present

## 2022-02-27 DIAGNOSIS — G629 Polyneuropathy, unspecified: Secondary | ICD-10-CM | POA: Diagnosis not present

## 2022-02-28 DIAGNOSIS — F411 Generalized anxiety disorder: Secondary | ICD-10-CM | POA: Diagnosis not present

## 2022-02-28 DIAGNOSIS — F5101 Primary insomnia: Secondary | ICD-10-CM | POA: Diagnosis not present

## 2022-03-01 DIAGNOSIS — G608 Other hereditary and idiopathic neuropathies: Secondary | ICD-10-CM | POA: Diagnosis not present

## 2022-03-02 DIAGNOSIS — G608 Other hereditary and idiopathic neuropathies: Secondary | ICD-10-CM | POA: Diagnosis not present

## 2022-03-03 DIAGNOSIS — G608 Other hereditary and idiopathic neuropathies: Secondary | ICD-10-CM | POA: Diagnosis not present

## 2022-03-04 DIAGNOSIS — G608 Other hereditary and idiopathic neuropathies: Secondary | ICD-10-CM | POA: Diagnosis not present

## 2022-03-05 DIAGNOSIS — G608 Other hereditary and idiopathic neuropathies: Secondary | ICD-10-CM | POA: Diagnosis not present

## 2022-03-08 DIAGNOSIS — H5310 Unspecified subjective visual disturbances: Secondary | ICD-10-CM | POA: Diagnosis not present

## 2022-03-08 DIAGNOSIS — M546 Pain in thoracic spine: Secondary | ICD-10-CM | POA: Diagnosis not present

## 2022-03-08 DIAGNOSIS — R202 Paresthesia of skin: Secondary | ICD-10-CM | POA: Diagnosis not present

## 2022-03-08 DIAGNOSIS — G608 Other hereditary and idiopathic neuropathies: Secondary | ICD-10-CM | POA: Diagnosis not present

## 2022-03-08 DIAGNOSIS — R208 Other disturbances of skin sensation: Secondary | ICD-10-CM | POA: Diagnosis not present

## 2022-03-13 DIAGNOSIS — L219 Seborrheic dermatitis, unspecified: Secondary | ICD-10-CM | POA: Diagnosis not present

## 2022-03-27 DIAGNOSIS — Z0000 Encounter for general adult medical examination without abnormal findings: Secondary | ICD-10-CM | POA: Diagnosis not present

## 2022-03-27 DIAGNOSIS — R739 Hyperglycemia, unspecified: Secondary | ICD-10-CM | POA: Diagnosis not present

## 2022-03-27 DIAGNOSIS — R748 Abnormal levels of other serum enzymes: Secondary | ICD-10-CM | POA: Diagnosis not present

## 2022-03-27 DIAGNOSIS — Z125 Encounter for screening for malignant neoplasm of prostate: Secondary | ICD-10-CM | POA: Diagnosis not present

## 2022-03-28 DIAGNOSIS — Z0000 Encounter for general adult medical examination without abnormal findings: Secondary | ICD-10-CM | POA: Diagnosis not present

## 2022-04-06 DIAGNOSIS — Z1339 Encounter for screening examination for other mental health and behavioral disorders: Secondary | ICD-10-CM | POA: Diagnosis not present

## 2022-04-06 DIAGNOSIS — Z0000 Encounter for general adult medical examination without abnormal findings: Secondary | ICD-10-CM | POA: Diagnosis not present

## 2022-04-06 DIAGNOSIS — R82998 Other abnormal findings in urine: Secondary | ICD-10-CM | POA: Diagnosis not present

## 2022-04-06 DIAGNOSIS — R748 Abnormal levels of other serum enzymes: Secondary | ICD-10-CM | POA: Diagnosis not present

## 2022-04-06 DIAGNOSIS — Z1331 Encounter for screening for depression: Secondary | ICD-10-CM | POA: Diagnosis not present

## 2022-04-06 DIAGNOSIS — G608 Other hereditary and idiopathic neuropathies: Secondary | ICD-10-CM | POA: Diagnosis not present

## 2022-04-19 DIAGNOSIS — R7989 Other specified abnormal findings of blood chemistry: Secondary | ICD-10-CM | POA: Diagnosis not present

## 2022-04-19 DIAGNOSIS — H532 Diplopia: Secondary | ICD-10-CM | POA: Diagnosis not present

## 2022-04-19 DIAGNOSIS — H5319 Other subjective visual disturbances: Secondary | ICD-10-CM | POA: Diagnosis not present

## 2022-04-20 DIAGNOSIS — G608 Other hereditary and idiopathic neuropathies: Secondary | ICD-10-CM | POA: Diagnosis not present

## 2022-05-04 DIAGNOSIS — R21 Rash and other nonspecific skin eruption: Secondary | ICD-10-CM | POA: Diagnosis not present

## 2022-05-04 DIAGNOSIS — L309 Dermatitis, unspecified: Secondary | ICD-10-CM | POA: Diagnosis not present

## 2022-05-04 DIAGNOSIS — L308 Other specified dermatitis: Secondary | ICD-10-CM | POA: Diagnosis not present

## 2022-05-09 DIAGNOSIS — M5416 Radiculopathy, lumbar region: Secondary | ICD-10-CM | POA: Diagnosis not present

## 2022-05-12 DIAGNOSIS — Z77120 Contact with and (suspected) exposure to mold (toxic): Secondary | ICD-10-CM | POA: Diagnosis not present

## 2022-05-12 DIAGNOSIS — R5383 Other fatigue: Secondary | ICD-10-CM | POA: Diagnosis not present

## 2022-05-12 DIAGNOSIS — G629 Polyneuropathy, unspecified: Secondary | ICD-10-CM | POA: Diagnosis not present

## 2022-05-12 DIAGNOSIS — E291 Testicular hypofunction: Secondary | ICD-10-CM | POA: Diagnosis not present

## 2022-05-16 ENCOUNTER — Encounter: Payer: Self-pay | Admitting: Internal Medicine

## 2022-05-22 DIAGNOSIS — G608 Other hereditary and idiopathic neuropathies: Secondary | ICD-10-CM | POA: Diagnosis not present

## 2022-05-23 DIAGNOSIS — G608 Other hereditary and idiopathic neuropathies: Secondary | ICD-10-CM | POA: Diagnosis not present

## 2022-05-29 DIAGNOSIS — M5416 Radiculopathy, lumbar region: Secondary | ICD-10-CM | POA: Diagnosis not present

## 2022-05-29 DIAGNOSIS — M6281 Muscle weakness (generalized): Secondary | ICD-10-CM | POA: Diagnosis not present

## 2022-05-30 DIAGNOSIS — B2790 Infectious mononucleosis, unspecified without complication: Secondary | ICD-10-CM | POA: Diagnosis not present

## 2022-05-30 DIAGNOSIS — E291 Testicular hypofunction: Secondary | ICD-10-CM | POA: Diagnosis not present

## 2022-05-31 DIAGNOSIS — F411 Generalized anxiety disorder: Secondary | ICD-10-CM | POA: Diagnosis not present

## 2022-05-31 DIAGNOSIS — F5101 Primary insomnia: Secondary | ICD-10-CM | POA: Diagnosis not present

## 2022-06-01 DIAGNOSIS — L42 Pityriasis rosea: Secondary | ICD-10-CM | POA: Diagnosis not present

## 2022-06-06 DIAGNOSIS — R4189 Other symptoms and signs involving cognitive functions and awareness: Secondary | ICD-10-CM | POA: Diagnosis not present

## 2022-06-06 DIAGNOSIS — G608 Other hereditary and idiopathic neuropathies: Secondary | ICD-10-CM | POA: Diagnosis not present

## 2022-06-08 ENCOUNTER — Ambulatory Visit (AMBULATORY_SURGERY_CENTER): Payer: Self-pay | Admitting: *Deleted

## 2022-06-08 VITALS — Ht 74.0 in | Wt 230.0 lb

## 2022-06-08 DIAGNOSIS — Z1211 Encounter for screening for malignant neoplasm of colon: Secondary | ICD-10-CM

## 2022-06-08 MED ORDER — NA SULFATE-K SULFATE-MG SULF 17.5-3.13-1.6 GM/177ML PO SOLN: 1.0000 | Freq: Once | ORAL | 0 refills | Status: AC

## 2022-06-08 NOTE — Progress Notes (Signed)
No egg or soy allergy known to patient  No issues known to pt with past sedation with any surgeries or procedures Patient denies ever being told they had issues or difficulty with intubation  No FH of Malignant Hyperthermia Pt is not on diet pills Pt is not on home 02  Pt is not on blood thinners  Pt denies issues with constipation  No A fib or A flutter Have any cardiac testing pending--NO Pt instructed to use Singlecare.com or GoodRx for a price reduction on prep   

## 2022-06-15 DIAGNOSIS — R748 Abnormal levels of other serum enzymes: Secondary | ICD-10-CM | POA: Diagnosis not present

## 2022-06-20 DIAGNOSIS — M5416 Radiculopathy, lumbar region: Secondary | ICD-10-CM | POA: Diagnosis not present

## 2022-06-20 DIAGNOSIS — M6281 Muscle weakness (generalized): Secondary | ICD-10-CM | POA: Diagnosis not present

## 2022-06-22 DIAGNOSIS — R4189 Other symptoms and signs involving cognitive functions and awareness: Secondary | ICD-10-CM | POA: Diagnosis not present

## 2022-06-22 DIAGNOSIS — G608 Other hereditary and idiopathic neuropathies: Secondary | ICD-10-CM | POA: Diagnosis not present

## 2022-06-23 ENCOUNTER — Encounter: Payer: Self-pay | Admitting: Internal Medicine

## 2022-06-25 ENCOUNTER — Encounter: Payer: Self-pay | Admitting: Certified Registered Nurse Anesthetist

## 2022-06-29 DIAGNOSIS — F411 Generalized anxiety disorder: Secondary | ICD-10-CM | POA: Diagnosis not present

## 2022-06-29 DIAGNOSIS — F5101 Primary insomnia: Secondary | ICD-10-CM | POA: Diagnosis not present

## 2022-07-03 ENCOUNTER — Ambulatory Visit (AMBULATORY_SURGERY_CENTER): Payer: BC Managed Care – PPO | Admitting: Internal Medicine

## 2022-07-03 ENCOUNTER — Encounter: Payer: Self-pay | Admitting: Internal Medicine

## 2022-07-03 VITALS — BP 112/62 | HR 80 | Temp 97.5°F | Resp 19 | Ht 74.0 in | Wt 230.0 lb

## 2022-07-03 DIAGNOSIS — D120 Benign neoplasm of cecum: Secondary | ICD-10-CM

## 2022-07-03 DIAGNOSIS — Z1211 Encounter for screening for malignant neoplasm of colon: Secondary | ICD-10-CM | POA: Diagnosis not present

## 2022-07-03 DIAGNOSIS — K635 Polyp of colon: Secondary | ICD-10-CM | POA: Diagnosis not present

## 2022-07-03 DIAGNOSIS — D123 Benign neoplasm of transverse colon: Secondary | ICD-10-CM

## 2022-07-03 MED ORDER — SODIUM CHLORIDE 0.9 % IV SOLN: 500.0000 mL | Freq: Once | INTRAVENOUS | Status: DC

## 2022-07-03 NOTE — Patient Instructions (Signed)
Handout on polyps and diverticulosis.  Await pathology results. Resume previous diet and continue present medications. Repeat colonoscopy for surveillance will be determined based off of pathology results.   YOU HAD AN ENDOSCOPIC PROCEDURE TODAY AT [facility]:   Refer to the procedure report that was given to you for any specific questions about what was found during the examination.  If the procedure report does not answer your questions, please call your gastroenterologist to clarify.  If you requested that your care partner not be given the details of your procedure findings, then the procedure report has been included in a sealed envelope for you to review at your convenience later.  YOU SHOULD EXPECT: Some feelings of bloating in the abdomen. Passage of more gas than usual.  Walking can help get rid of the air that was put into your GI tract during the procedure and reduce the bloating. If you had a lower endoscopy (such as a colonoscopy or flexible sigmoidoscopy) you may notice spotting of blood in your stool or on the toilet paper. If you underwent a bowel prep for your procedure, you may not have a normal bowel movement for a few days.  Please Note:  You might notice some irritation and congestion in your nose or some drainage.  This is from the oxygen used during your procedure.  There is no need for concern and it should clear up in a day or so.  SYMPTOMS TO REPORT IMMEDIATELY:  Following lower endoscopy (colonoscopy or flexible sigmoidoscopy):  Excessive amounts of blood in the stool  Significant tenderness or worsening of abdominal pains  Swelling of the abdomen that is new, acute  Fever of 100F or higher  For urgent or emergent issues, a gastroenterologist can be reached at any hour by calling [phone]. Do not use MyChart messaging for urgent concerns.    DIET:  We do recommend a small meal at first, but then you may proceed to your regular diet.  Drink  plenty of fluids but you should avoid alcoholic beverages for 24 hours.  ACTIVITY:  You should plan to take it easy for the rest of today and you should NOT DRIVE or use heavy machinery until tomorrow (because of the sedation medicines used during the test).    FOLLOW UP: Our staff will call the number listed on your records the next business day following your procedure.  We will call around 7:15- 8:00 am to check on you and address any questions or concerns that you may have regarding the information given to you following your procedure. If we do not reach you, we will leave a message.     If any biopsies were taken you will be contacted by phone or by letter within the next 1-3 weeks.  Please call us at [phone] if you have not heard about the biopsies in 3 weeks.    SIGNATURES/CONFIDENTIALITY: You and/or your care partner have signed paperwork which will be entered into your electronic medical record.  These signatures attest to the fact that that the information above on your After Visit Summary has been reviewed and is understood.  Full responsibility of the confidentiality of this discharge information lies with you and/or your care-partner.

## 2022-07-03 NOTE — Op Note (Signed)
[facility] Patient Name: Ricky Andersen Procedure Date: 07/03/2022 9:57 AM MRN: [id number] Endoscopist: Jerene Bears , MD Age: 51 Referring MD:  Date of Birth: June 21, 1971 Gender: Male Account #: [id number] Procedure:                Colonoscopy Indications:              Screening for colorectal malignant neoplasm, This                            is the patient's first colonoscopy Medicines:                Monitored Anesthesia Care Procedure:                Pre-Anesthesia Assessment:                           - Prior to the procedure, a History and Physical                            was performed, and patient medications and                            allergies were reviewed. The patient's tolerance of                            previous anesthesia was also reviewed. The risks                            and benefits of the procedure and the sedation                            options and risks were discussed with the patient.                            All questions were answered, and informed consent                            was obtained. Prior Anticoagulants: The patient has                            taken no previous anticoagulant or antiplatelet                            agents. ASA Grade Assessment: II - A patient with                            mild systemic disease. After reviewing the risks                            and benefits, the patient was deemed in                            satisfactory condition to undergo the procedure.  After obtaining informed consent, the colonoscope                            was passed under direct vision. Throughout the                            procedure, the patient's blood pressure, pulse, and                            oxygen saturations were monitored continuously. The                            Olympus CF-HQ190L [id number]) Colonoscope was                            introduced through the anus and  advanced to the                            cecum, identified by appendiceal orifice and                            ileocecal valve. The colonoscopy was performed                            without difficulty. The patient tolerated the                            procedure well. The quality of the bowel                            preparation was excellent. The ileocecal valve,                            appendiceal orifice, and rectum were photographed. Scope In: 10:02:29 AM Scope Out: 10:17:26 AM Scope Withdrawal Time: 0 hours 12 minutes 31 seconds  Total Procedure Duration: 0 hours 14 minutes 57 seconds  Findings:                 The digital rectal exam was normal.                           A 3 mm polyp was found in the cecum. The polyp was                            sessile. The polyp was removed with a cold snare.                            Resection and retrieval were complete.                           Two sessile polyps were found in the hepatic                            flexure. The polyps were 5 to 8 mm  in size. These                            polyps were removed with a cold snare. Resection                            and retrieval were complete.                           A few small-mouthed diverticula were found in the                            sigmoid colon.                           The retroflexed view of the distal rectum and anal                            verge was normal and showed no anal or rectal                            abnormalities. Complications:            No immediate complications. Estimated Blood Loss:     Estimated blood loss: none. Impression:               - One 3 mm polyp in the cecum, removed with a cold                            snare. Resected and retrieved.                           - Two 5 to 8 mm polyps at the hepatic flexure,                            removed with a cold snare. Resected and retrieved.                           - Mild  diverticulosis in the sigmoid colon.                           - The distal rectum and anal verge are normal on                            retroflexion view. Recommendation:           - Patient has a contact number available for                            emergencies. The signs and symptoms of potential                            delayed complications were discussed with the                            patient.  Return to normal activities tomorrow.                            Written discharge instructions were provided to the                            patient.                           - Resume previous diet.                           - Continue present medications.                           - Await pathology results.                           - Repeat colonoscopy is recommended. The                            colonoscopy date will be determined after pathology                            results from today's exam become available for                            review. Jerene Bears, MD 07/03/2022 10:20:23 AM This report has been signed electronically.

## 2022-07-03 NOTE — Progress Notes (Signed)
GASTROENTEROLOGY PROCEDURE H&P NOTE   Primary Care Physician: Patient, No Pcp Per    Reason for Procedure:  Colon cancer screening  Plan:    Screening colonoscopy  Patient is appropriate for endoscopic procedure(s) in the ambulatory ([facility]) setting.  The nature of the procedure, as well as the risks, benefits, and alternatives were carefully and thoroughly reviewed with the patient. Ample time for discussion and questions allowed. The patient understood, was satisfied, and agreed to proceed.     HPI: Ricky Andersen is a 51 y.o. male who presents for screening colonoscopy.  Medical history as below.  Tolerated the prep.  No recent chest pain or shortness of breath.  No abdominal pain today.  Past Medical History:  Diagnosis Date   Anxiety    Arthritis    Clotting disorder ([place])    Neuromuscular disorder ([place])    MORVAN   Tachycardia    RESOLVED    Past Surgical History:  Procedure Laterality Date   HIP ARTHROSCOPY W/ LABRAL DEBRIDEMENT Right     Prior to Admission medications   Medication Sig Start Date End Date Taking? Authorizing Provider  augmented betamethasone dipropionate (DIPROLENE-AF) 0.05 % cream    Yes [provider]  Eszopiclone (LUNESTA PO) Take by mouth.   Yes [provider]  Multiple Vitamins-Minerals (MULTIVITAMIN ADULTS PO) Take by mouth daily.   Yes [provider]  naltrexone (DEPADE) 50 MG tablet Take 4 mg by mouth daily.   Yes [provider]  Omega-3 Fatty Acids (FISH OIL) 1000 MG CAPS Take by mouth daily.   Yes [provider]  propranolol (INDERAL) 10 MG tablet Take 10 mg by mouth daily as needed.    [provider]    Current Outpatient Medications  Medication Sig Dispense Refill   augmented betamethasone dipropionate (DIPROLENE-AF) 0.05 % cream      Eszopiclone (LUNESTA PO) Take by mouth.     Multiple Vitamins-Minerals (MULTIVITAMIN ADULTS PO) Take by mouth daily.     naltrexone  (DEPADE) 50 MG tablet Take 4 mg by mouth daily.     Omega-3 Fatty Acids (FISH OIL) 1000 MG CAPS Take by mouth daily.     propranolol (INDERAL) 10 MG tablet Take 10 mg by mouth daily as needed.     Current Facility-Administered Medications  Medication Dose Route Frequency Provider Last Rate Last Admin   0.9 %  sodium chloride infusion  500 mL Intravenous Once Jessamy Torosyan, Lajuan Lines, MD        Allergies as of 07/03/2022 - Review Complete 07/03/2022  Allergen Reaction Noted   Pregabalin Hives and Other (See Comments) 04/08/2021   Aleve [naproxen]  05/17/2020    Family History  Problem Relation Age of Onset   Colon polyps Sister    Anesthesia problems Neg Hx    Broken bones Neg Hx    Cancer Neg Hx    Clotting disorder Neg Hx    Collagen disease Neg Hx    Diabetes Neg Hx    Dislocations Neg Hx    Osteoporosis Neg Hx    Rheumatologic disease Neg Hx    Scoliosis Neg Hx    Severe sprains Neg Hx    Colon cancer Neg Hx    Crohn's disease Neg Hx    Esophageal cancer Neg Hx    Rectal cancer Neg Hx    Stomach cancer Neg Hx    Ulcerative colitis Neg Hx     Social History   Socioeconomic History   Marital  status: Married    Spouse name: Not on file   Number of children: Not on file   Years of education: Not on file   Highest education level: Not on file  Occupational History   Not on file  Tobacco Use   Smoking status: Never    Passive exposure: Past (PARENTS)   Smokeless tobacco: Never  Vaping Use   Vaping Use: Never used  Substance and Sexual Activity   Alcohol use: Not Currently   Drug use: No   Sexual activity: Yes  Other Topics Concern   Not on file  Social History Narrative   Not on file   Social Determinants of Health   Financial Resource Strain: Not on file  Food Insecurity: Not on file  Transportation Needs: Not on file  Physical Activity: Not on file  Stress: Not on file  Social Connections: Not on file  Intimate Partner Violence: Not on file    Physical  Exam: Vital signs in last 24 hours: '@BP'$  124/80   Pulse 75   Temp (!) 97.5 F (36.4 C) (Skin)   Ht '6\' 2"'$  (1.88 m)   Wt 230 lb (104.3 kg)   SpO2 99%   BMI 29.53 kg/m  GEN: NAD EYE: Sclerae anicteric ENT: MMM CV: Non-tachycardic Pulm: CTA b/l GI: Soft, NT/ND NEURO:  Alert & Oriented x 3   Zenovia Jarred, MD [facility]  07/03/2022 9:52 AM

## 2022-07-03 NOTE — Progress Notes (Signed)
Report given to PACU, vss 

## 2022-07-03 NOTE — Progress Notes (Signed)
Pt's states no medical or surgical changes since previsit or office visit. VS assessed by N.S 

## 2022-07-04 ENCOUNTER — Telehealth: Payer: Self-pay | Admitting: *Deleted

## 2022-07-04 NOTE — Telephone Encounter (Signed)
Post procedure follow up call placed, no answer and left VM.  

## 2022-07-06 ENCOUNTER — Encounter: Payer: Self-pay | Admitting: Internal Medicine

## 2022-07-07 DIAGNOSIS — H5711 Ocular pain, right eye: Secondary | ICD-10-CM | POA: Diagnosis not present

## 2022-07-07 DIAGNOSIS — Z9889 Other specified postprocedural states: Secondary | ICD-10-CM | POA: Diagnosis not present

## 2022-07-07 DIAGNOSIS — H5319 Other subjective visual disturbances: Secondary | ICD-10-CM | POA: Diagnosis not present

## 2022-07-07 DIAGNOSIS — H5359 Other color vision deficiencies: Secondary | ICD-10-CM | POA: Diagnosis not present

## 2022-07-12 DIAGNOSIS — F5101 Primary insomnia: Secondary | ICD-10-CM | POA: Diagnosis not present

## 2022-07-12 DIAGNOSIS — F411 Generalized anxiety disorder: Secondary | ICD-10-CM | POA: Diagnosis not present

## 2022-07-26 DIAGNOSIS — F5101 Primary insomnia: Secondary | ICD-10-CM | POA: Diagnosis not present

## 2022-07-26 DIAGNOSIS — F411 Generalized anxiety disorder: Secondary | ICD-10-CM | POA: Diagnosis not present

## 2022-08-10 DIAGNOSIS — M25552 Pain in left hip: Secondary | ICD-10-CM | POA: Diagnosis not present

## 2022-08-10 DIAGNOSIS — M7918 Myalgia, other site: Secondary | ICD-10-CM | POA: Diagnosis not present

## 2022-08-10 DIAGNOSIS — G8929 Other chronic pain: Secondary | ICD-10-CM | POA: Diagnosis not present

## 2022-08-10 DIAGNOSIS — M5481 Occipital neuralgia: Secondary | ICD-10-CM | POA: Diagnosis not present

## 2022-08-16 DIAGNOSIS — F411 Generalized anxiety disorder: Secondary | ICD-10-CM | POA: Diagnosis not present

## 2022-08-16 DIAGNOSIS — F5101 Primary insomnia: Secondary | ICD-10-CM | POA: Diagnosis not present

## 2022-08-17 DIAGNOSIS — F411 Generalized anxiety disorder: Secondary | ICD-10-CM | POA: Diagnosis not present

## 2022-08-17 DIAGNOSIS — L573 Poikiloderma of Civatte: Secondary | ICD-10-CM | POA: Diagnosis not present

## 2022-08-17 DIAGNOSIS — L309 Dermatitis, unspecified: Secondary | ICD-10-CM | POA: Diagnosis not present

## 2022-08-17 DIAGNOSIS — F900 Attention-deficit hyperactivity disorder, predominantly inattentive type: Secondary | ICD-10-CM | POA: Diagnosis not present

## 2022-08-17 DIAGNOSIS — F3289 Other specified depressive episodes: Secondary | ICD-10-CM | POA: Diagnosis not present

## 2022-08-17 DIAGNOSIS — L738 Other specified follicular disorders: Secondary | ICD-10-CM | POA: Diagnosis not present

## 2022-08-17 DIAGNOSIS — G4700 Insomnia, unspecified: Secondary | ICD-10-CM | POA: Diagnosis not present

## 2022-08-17 DIAGNOSIS — D225 Melanocytic nevi of trunk: Secondary | ICD-10-CM | POA: Diagnosis not present

## 2022-08-30 DIAGNOSIS — F5101 Primary insomnia: Secondary | ICD-10-CM | POA: Diagnosis not present

## 2022-08-30 DIAGNOSIS — F411 Generalized anxiety disorder: Secondary | ICD-10-CM | POA: Diagnosis not present

## 2022-09-28 DIAGNOSIS — F5101 Primary insomnia: Secondary | ICD-10-CM | POA: Diagnosis not present

## 2022-09-28 DIAGNOSIS — F411 Generalized anxiety disorder: Secondary | ICD-10-CM | POA: Diagnosis not present

## 2022-09-28 DIAGNOSIS — F3289 Other specified depressive episodes: Secondary | ICD-10-CM | POA: Diagnosis not present

## 2022-10-04 DIAGNOSIS — F5101 Primary insomnia: Secondary | ICD-10-CM | POA: Diagnosis not present

## 2022-10-04 DIAGNOSIS — F411 Generalized anxiety disorder: Secondary | ICD-10-CM | POA: Diagnosis not present

## 2022-10-12 DIAGNOSIS — G608 Other hereditary and idiopathic neuropathies: Secondary | ICD-10-CM | POA: Diagnosis not present

## 2022-10-21 DIAGNOSIS — R790 Abnormal level of blood mineral: Secondary | ICD-10-CM | POA: Diagnosis not present

## 2022-10-21 DIAGNOSIS — E538 Deficiency of other specified B group vitamins: Secondary | ICD-10-CM | POA: Diagnosis not present

## 2022-10-21 DIAGNOSIS — E559 Vitamin D deficiency, unspecified: Secondary | ICD-10-CM | POA: Diagnosis not present

## 2022-10-21 DIAGNOSIS — E291 Testicular hypofunction: Secondary | ICD-10-CM | POA: Diagnosis not present

## 2022-10-21 DIAGNOSIS — R5383 Other fatigue: Secondary | ICD-10-CM | POA: Diagnosis not present

## 2022-10-21 DIAGNOSIS — Z131 Encounter for screening for diabetes mellitus: Secondary | ICD-10-CM | POA: Diagnosis not present

## 2022-10-21 DIAGNOSIS — G608 Other hereditary and idiopathic neuropathies: Secondary | ICD-10-CM | POA: Diagnosis not present

## 2022-10-23 DIAGNOSIS — G608 Other hereditary and idiopathic neuropathies: Secondary | ICD-10-CM | POA: Diagnosis not present

## 2022-10-24 DIAGNOSIS — G608 Other hereditary and idiopathic neuropathies: Secondary | ICD-10-CM | POA: Diagnosis not present

## 2022-10-25 DIAGNOSIS — H0279 Other degenerative disorders of eyelid and periocular area: Secondary | ICD-10-CM | POA: Diagnosis not present

## 2022-10-25 DIAGNOSIS — H02831 Dermatochalasis of right upper eyelid: Secondary | ICD-10-CM | POA: Diagnosis not present

## 2022-10-25 DIAGNOSIS — F5101 Primary insomnia: Secondary | ICD-10-CM | POA: Diagnosis not present

## 2022-10-25 DIAGNOSIS — F411 Generalized anxiety disorder: Secondary | ICD-10-CM | POA: Diagnosis not present

## 2022-10-25 DIAGNOSIS — H57813 Brow ptosis, bilateral: Secondary | ICD-10-CM | POA: Diagnosis not present

## 2022-10-25 DIAGNOSIS — H02834 Dermatochalasis of left upper eyelid: Secondary | ICD-10-CM | POA: Diagnosis not present

## 2022-11-07 DIAGNOSIS — F5101 Primary insomnia: Secondary | ICD-10-CM | POA: Diagnosis not present

## 2022-11-07 DIAGNOSIS — F411 Generalized anxiety disorder: Secondary | ICD-10-CM | POA: Diagnosis not present

## 2022-11-29 DIAGNOSIS — G608 Other hereditary and idiopathic neuropathies: Secondary | ICD-10-CM | POA: Diagnosis not present

## 2022-11-29 DIAGNOSIS — E291 Testicular hypofunction: Secondary | ICD-10-CM | POA: Diagnosis not present

## 2022-11-29 DIAGNOSIS — G629 Polyneuropathy, unspecified: Secondary | ICD-10-CM | POA: Diagnosis not present

## 2022-11-29 DIAGNOSIS — R5383 Other fatigue: Secondary | ICD-10-CM | POA: Diagnosis not present

## 2022-12-11 DIAGNOSIS — M7702 Medial epicondylitis, left elbow: Secondary | ICD-10-CM | POA: Diagnosis not present

## 2022-12-11 DIAGNOSIS — M25512 Pain in left shoulder: Secondary | ICD-10-CM | POA: Diagnosis not present

## 2022-12-20 DIAGNOSIS — M7702 Medial epicondylitis, left elbow: Secondary | ICD-10-CM | POA: Diagnosis not present

## 2022-12-20 DIAGNOSIS — M25512 Pain in left shoulder: Secondary | ICD-10-CM | POA: Diagnosis not present

## 2022-12-22 DIAGNOSIS — M25512 Pain in left shoulder: Secondary | ICD-10-CM | POA: Diagnosis not present

## 2022-12-22 DIAGNOSIS — M7702 Medial epicondylitis, left elbow: Secondary | ICD-10-CM | POA: Diagnosis not present

## 2022-12-26 DIAGNOSIS — F411 Generalized anxiety disorder: Secondary | ICD-10-CM | POA: Diagnosis not present

## 2022-12-26 DIAGNOSIS — F5101 Primary insomnia: Secondary | ICD-10-CM | POA: Diagnosis not present

## 2022-12-27 DIAGNOSIS — M7702 Medial epicondylitis, left elbow: Secondary | ICD-10-CM | POA: Diagnosis not present

## 2022-12-27 DIAGNOSIS — M25512 Pain in left shoulder: Secondary | ICD-10-CM | POA: Diagnosis not present

## 2022-12-29 DIAGNOSIS — M7702 Medial epicondylitis, left elbow: Secondary | ICD-10-CM | POA: Diagnosis not present

## 2022-12-29 DIAGNOSIS — M25512 Pain in left shoulder: Secondary | ICD-10-CM | POA: Diagnosis not present

## 2023-01-10 DIAGNOSIS — M7702 Medial epicondylitis, left elbow: Secondary | ICD-10-CM | POA: Diagnosis not present

## 2023-01-10 DIAGNOSIS — M25512 Pain in left shoulder: Secondary | ICD-10-CM | POA: Diagnosis not present

## 2023-01-10 DIAGNOSIS — F5101 Primary insomnia: Secondary | ICD-10-CM | POA: Diagnosis not present

## 2023-01-10 DIAGNOSIS — F411 Generalized anxiety disorder: Secondary | ICD-10-CM | POA: Diagnosis not present

## 2023-01-12 DIAGNOSIS — M7702 Medial epicondylitis, left elbow: Secondary | ICD-10-CM | POA: Diagnosis not present

## 2023-01-12 DIAGNOSIS — M25512 Pain in left shoulder: Secondary | ICD-10-CM | POA: Diagnosis not present

## 2023-01-17 DIAGNOSIS — M25512 Pain in left shoulder: Secondary | ICD-10-CM | POA: Diagnosis not present

## 2023-01-17 DIAGNOSIS — M7702 Medial epicondylitis, left elbow: Secondary | ICD-10-CM | POA: Diagnosis not present

## 2023-01-25 DIAGNOSIS — F900 Attention-deficit hyperactivity disorder, predominantly inattentive type: Secondary | ICD-10-CM | POA: Diagnosis not present

## 2023-01-25 DIAGNOSIS — F411 Generalized anxiety disorder: Secondary | ICD-10-CM | POA: Diagnosis not present

## 2023-01-25 DIAGNOSIS — F5101 Primary insomnia: Secondary | ICD-10-CM | POA: Diagnosis not present

## 2023-01-26 DIAGNOSIS — M7702 Medial epicondylitis, left elbow: Secondary | ICD-10-CM | POA: Diagnosis not present

## 2023-01-26 DIAGNOSIS — M25512 Pain in left shoulder: Secondary | ICD-10-CM | POA: Diagnosis not present

## 2023-01-29 DIAGNOSIS — G608 Other hereditary and idiopathic neuropathies: Secondary | ICD-10-CM | POA: Diagnosis not present

## 2023-01-30 DIAGNOSIS — G608 Other hereditary and idiopathic neuropathies: Secondary | ICD-10-CM | POA: Diagnosis not present

## 2023-01-31 DIAGNOSIS — F5101 Primary insomnia: Secondary | ICD-10-CM | POA: Diagnosis not present

## 2023-01-31 DIAGNOSIS — F411 Generalized anxiety disorder: Secondary | ICD-10-CM | POA: Diagnosis not present

## 2023-02-01 DIAGNOSIS — M25512 Pain in left shoulder: Secondary | ICD-10-CM | POA: Diagnosis not present

## 2023-02-01 DIAGNOSIS — M7702 Medial epicondylitis, left elbow: Secondary | ICD-10-CM | POA: Diagnosis not present

## 2023-02-14 DIAGNOSIS — M7702 Medial epicondylitis, left elbow: Secondary | ICD-10-CM | POA: Diagnosis not present

## 2023-03-01 DIAGNOSIS — F411 Generalized anxiety disorder: Secondary | ICD-10-CM | POA: Diagnosis not present

## 2023-03-01 DIAGNOSIS — F5101 Primary insomnia: Secondary | ICD-10-CM | POA: Diagnosis not present

## 2023-03-06 DIAGNOSIS — M7702 Medial epicondylitis, left elbow: Secondary | ICD-10-CM | POA: Diagnosis not present

## 2023-03-27 DIAGNOSIS — F411 Generalized anxiety disorder: Secondary | ICD-10-CM | POA: Diagnosis not present

## 2023-03-27 DIAGNOSIS — F5101 Primary insomnia: Secondary | ICD-10-CM | POA: Diagnosis not present

## 2023-04-10 DIAGNOSIS — M7702 Medial epicondylitis, left elbow: Secondary | ICD-10-CM | POA: Diagnosis not present

## 2023-04-16 DIAGNOSIS — S56212A Strain of other flexor muscle, fascia and tendon at forearm level, left arm, initial encounter: Secondary | ICD-10-CM | POA: Diagnosis not present

## 2023-04-16 DIAGNOSIS — M25522 Pain in left elbow: Secondary | ICD-10-CM | POA: Diagnosis not present

## 2023-04-16 DIAGNOSIS — G8929 Other chronic pain: Secondary | ICD-10-CM | POA: Diagnosis not present

## 2023-04-16 DIAGNOSIS — M6788 Other specified disorders of synovium and tendon, other site: Secondary | ICD-10-CM | POA: Diagnosis not present

## 2023-04-16 DIAGNOSIS — M7918 Myalgia, other site: Secondary | ICD-10-CM | POA: Diagnosis not present

## 2023-04-20 DIAGNOSIS — R7989 Other specified abnormal findings of blood chemistry: Secondary | ICD-10-CM | POA: Diagnosis not present

## 2023-04-20 DIAGNOSIS — Z125 Encounter for screening for malignant neoplasm of prostate: Secondary | ICD-10-CM | POA: Diagnosis not present

## 2023-04-20 DIAGNOSIS — Z79899 Other long term (current) drug therapy: Secondary | ICD-10-CM | POA: Diagnosis not present

## 2023-04-20 DIAGNOSIS — R739 Hyperglycemia, unspecified: Secondary | ICD-10-CM | POA: Diagnosis not present

## 2023-04-26 DIAGNOSIS — F5101 Primary insomnia: Secondary | ICD-10-CM | POA: Diagnosis not present

## 2023-04-26 DIAGNOSIS — F411 Generalized anxiety disorder: Secondary | ICD-10-CM | POA: Diagnosis not present

## 2023-04-27 DIAGNOSIS — G608 Other hereditary and idiopathic neuropathies: Secondary | ICD-10-CM | POA: Diagnosis not present

## 2023-04-27 DIAGNOSIS — R82998 Other abnormal findings in urine: Secondary | ICD-10-CM | POA: Diagnosis not present

## 2023-04-27 DIAGNOSIS — Z0000 Encounter for general adult medical examination without abnormal findings: Secondary | ICD-10-CM | POA: Diagnosis not present

## 2023-04-27 DIAGNOSIS — Z23 Encounter for immunization: Secondary | ICD-10-CM | POA: Diagnosis not present

## 2023-04-27 DIAGNOSIS — Z1339 Encounter for screening examination for other mental health and behavioral disorders: Secondary | ICD-10-CM | POA: Diagnosis not present

## 2023-04-27 DIAGNOSIS — Z1331 Encounter for screening for depression: Secondary | ICD-10-CM | POA: Diagnosis not present

## 2023-05-11 DIAGNOSIS — G5622 Lesion of ulnar nerve, left upper limb: Secondary | ICD-10-CM | POA: Diagnosis not present

## 2023-05-11 DIAGNOSIS — M7702 Medial epicondylitis, left elbow: Secondary | ICD-10-CM | POA: Diagnosis not present

## 2023-05-15 DIAGNOSIS — G608 Other hereditary and idiopathic neuropathies: Secondary | ICD-10-CM | POA: Diagnosis not present

## 2023-05-16 DIAGNOSIS — G608 Other hereditary and idiopathic neuropathies: Secondary | ICD-10-CM | POA: Diagnosis not present

## 2023-05-18 DIAGNOSIS — E291 Testicular hypofunction: Secondary | ICD-10-CM | POA: Diagnosis not present

## 2023-05-18 DIAGNOSIS — E538 Deficiency of other specified B group vitamins: Secondary | ICD-10-CM | POA: Diagnosis not present

## 2023-05-18 DIAGNOSIS — E559 Vitamin D deficiency, unspecified: Secondary | ICD-10-CM | POA: Diagnosis not present

## 2023-05-18 DIAGNOSIS — R790 Abnormal level of blood mineral: Secondary | ICD-10-CM | POA: Diagnosis not present

## 2023-05-18 DIAGNOSIS — G629 Polyneuropathy, unspecified: Secondary | ICD-10-CM | POA: Diagnosis not present

## 2023-05-30 DIAGNOSIS — F411 Generalized anxiety disorder: Secondary | ICD-10-CM | POA: Diagnosis not present

## 2023-05-30 DIAGNOSIS — F5101 Primary insomnia: Secondary | ICD-10-CM | POA: Diagnosis not present

## 2023-05-30 DIAGNOSIS — G608 Other hereditary and idiopathic neuropathies: Secondary | ICD-10-CM | POA: Diagnosis not present

## 2023-05-30 DIAGNOSIS — E291 Testicular hypofunction: Secondary | ICD-10-CM | POA: Diagnosis not present

## 2023-05-30 DIAGNOSIS — G629 Polyneuropathy, unspecified: Secondary | ICD-10-CM | POA: Diagnosis not present

## 2023-05-30 DIAGNOSIS — R5383 Other fatigue: Secondary | ICD-10-CM | POA: Diagnosis not present

## 2023-06-21 DIAGNOSIS — F5101 Primary insomnia: Secondary | ICD-10-CM | POA: Diagnosis not present

## 2023-06-21 DIAGNOSIS — F411 Generalized anxiety disorder: Secondary | ICD-10-CM | POA: Diagnosis not present

## 2023-07-11 DIAGNOSIS — F5101 Primary insomnia: Secondary | ICD-10-CM | POA: Diagnosis not present

## 2023-07-11 DIAGNOSIS — F411 Generalized anxiety disorder: Secondary | ICD-10-CM | POA: Diagnosis not present

## 2023-07-26 DIAGNOSIS — F411 Generalized anxiety disorder: Secondary | ICD-10-CM | POA: Diagnosis not present

## 2023-07-26 DIAGNOSIS — F5101 Primary insomnia: Secondary | ICD-10-CM | POA: Diagnosis not present

## 2023-07-26 DIAGNOSIS — F900 Attention-deficit hyperactivity disorder, predominantly inattentive type: Secondary | ICD-10-CM | POA: Diagnosis not present

## 2023-08-02 DIAGNOSIS — G608 Other hereditary and idiopathic neuropathies: Secondary | ICD-10-CM | POA: Diagnosis not present

## 2023-08-02 DIAGNOSIS — R471 Dysarthria and anarthria: Secondary | ICD-10-CM | POA: Diagnosis not present

## 2023-08-03 DIAGNOSIS — M7702 Medial epicondylitis, left elbow: Secondary | ICD-10-CM | POA: Diagnosis not present

## 2023-08-03 DIAGNOSIS — M25512 Pain in left shoulder: Secondary | ICD-10-CM | POA: Diagnosis not present

## 2023-08-07 DIAGNOSIS — F411 Generalized anxiety disorder: Secondary | ICD-10-CM | POA: Diagnosis not present

## 2023-08-07 DIAGNOSIS — F5101 Primary insomnia: Secondary | ICD-10-CM | POA: Diagnosis not present

## 2023-08-07 DIAGNOSIS — G608 Other hereditary and idiopathic neuropathies: Secondary | ICD-10-CM | POA: Diagnosis not present

## 2023-08-16 DIAGNOSIS — R590 Localized enlarged lymph nodes: Secondary | ICD-10-CM | POA: Diagnosis not present

## 2023-08-17 DIAGNOSIS — M7702 Medial epicondylitis, left elbow: Secondary | ICD-10-CM | POA: Diagnosis not present

## 2023-08-17 DIAGNOSIS — M25512 Pain in left shoulder: Secondary | ICD-10-CM | POA: Diagnosis not present

## 2023-08-21 DIAGNOSIS — D2261 Melanocytic nevi of right upper limb, including shoulder: Secondary | ICD-10-CM | POA: Diagnosis not present

## 2023-08-21 DIAGNOSIS — D225 Melanocytic nevi of trunk: Secondary | ICD-10-CM | POA: Diagnosis not present

## 2023-08-21 DIAGNOSIS — L814 Other melanin hyperpigmentation: Secondary | ICD-10-CM | POA: Diagnosis not present

## 2023-08-21 DIAGNOSIS — D2262 Melanocytic nevi of left upper limb, including shoulder: Secondary | ICD-10-CM | POA: Diagnosis not present

## 2023-08-30 DIAGNOSIS — F411 Generalized anxiety disorder: Secondary | ICD-10-CM | POA: Diagnosis not present

## 2023-08-30 DIAGNOSIS — F5101 Primary insomnia: Secondary | ICD-10-CM | POA: Diagnosis not present

## 2023-09-25 DIAGNOSIS — F5101 Primary insomnia: Secondary | ICD-10-CM | POA: Diagnosis not present

## 2023-09-25 DIAGNOSIS — F411 Generalized anxiety disorder: Secondary | ICD-10-CM | POA: Diagnosis not present

## 2023-10-09 DIAGNOSIS — S46011A Strain of muscle(s) and tendon(s) of the rotator cuff of right shoulder, initial encounter: Secondary | ICD-10-CM | POA: Diagnosis not present

## 2023-10-10 DIAGNOSIS — F411 Generalized anxiety disorder: Secondary | ICD-10-CM | POA: Diagnosis not present

## 2023-10-10 DIAGNOSIS — F5101 Primary insomnia: Secondary | ICD-10-CM | POA: Diagnosis not present

## 2024-07-14 ENCOUNTER — Ambulatory Visit: Admitting: Physician Assistant
